# Patient Record
Sex: Female | Born: 1935 | Race: White | Hispanic: No | Smoking: Never smoker
Health system: Southern US, Community
[De-identification: ages and names within clinical notes are randomized; demographics above are authoritative.]

## PROBLEM LIST (undated history)

## (undated) DIAGNOSIS — I10 Essential (primary) hypertension: Secondary | ICD-10-CM

## (undated) DIAGNOSIS — E119 Type 2 diabetes mellitus without complications: Secondary | ICD-10-CM

## (undated) DIAGNOSIS — R569 Unspecified convulsions: Secondary | ICD-10-CM

---

## 2008-05-22 ENCOUNTER — Ambulatory Visit: Payer: Self-pay | Admitting: Family Medicine

## 2008-05-22 DIAGNOSIS — E109 Type 1 diabetes mellitus without complications: Secondary | ICD-10-CM | POA: Insufficient documentation

## 2008-05-22 DIAGNOSIS — I10 Essential (primary) hypertension: Secondary | ICD-10-CM | POA: Insufficient documentation

## 2008-05-22 DIAGNOSIS — K219 Gastro-esophageal reflux disease without esophagitis: Secondary | ICD-10-CM | POA: Insufficient documentation

## 2008-05-22 DIAGNOSIS — J029 Acute pharyngitis, unspecified: Secondary | ICD-10-CM | POA: Insufficient documentation

## 2008-05-23 ENCOUNTER — Encounter: Payer: Self-pay | Admitting: Family Medicine

## 2008-05-23 ENCOUNTER — Telehealth (INDEPENDENT_AMBULATORY_CARE_PROVIDER_SITE_OTHER): Payer: Self-pay | Admitting: *Deleted

## 2008-05-23 LAB — CONVERTED CEMR LAB: Streptococcus, Group A Screen (Direct): NEGATIVE

## 2018-10-09 ENCOUNTER — Emergency Department (HOSPITAL_COMMUNITY): Payer: Medicare Other

## 2018-10-09 ENCOUNTER — Inpatient Hospital Stay (HOSPITAL_COMMUNITY): Payer: Medicare Other

## 2018-10-09 ENCOUNTER — Inpatient Hospital Stay (HOSPITAL_COMMUNITY)
Admission: EM | Admit: 2018-10-09 | Discharge: 2018-10-19 | DRG: 603 | Disposition: A | Discharge status: Skilled Nursing Facility | Payer: Medicare Other | Attending: Internal Medicine | Admitting: Internal Medicine

## 2018-10-09 ENCOUNTER — Other Ambulatory Visit: Payer: Self-pay

## 2018-10-09 ENCOUNTER — Encounter (HOSPITAL_COMMUNITY): Payer: Self-pay | Admitting: *Deleted

## 2018-10-09 DIAGNOSIS — N179 Acute kidney failure, unspecified: Secondary | ICD-10-CM | POA: Diagnosis present

## 2018-10-09 DIAGNOSIS — Z79899 Other long term (current) drug therapy: Secondary | ICD-10-CM

## 2018-10-09 DIAGNOSIS — K5900 Constipation, unspecified: Secondary | ICD-10-CM | POA: Diagnosis not present

## 2018-10-09 DIAGNOSIS — Z1159 Encounter for screening for other viral diseases: Secondary | ICD-10-CM | POA: Diagnosis not present

## 2018-10-09 DIAGNOSIS — K7469 Other cirrhosis of liver: Secondary | ICD-10-CM | POA: Diagnosis not present

## 2018-10-09 DIAGNOSIS — L03115 Cellulitis of right lower limb: Secondary | ICD-10-CM | POA: Diagnosis present

## 2018-10-09 DIAGNOSIS — F603 Borderline personality disorder: Secondary | ICD-10-CM | POA: Diagnosis present

## 2018-10-09 DIAGNOSIS — K7290 Hepatic failure, unspecified without coma: Secondary | ICD-10-CM | POA: Diagnosis present

## 2018-10-09 DIAGNOSIS — R680 Hypothermia, not associated with low environmental temperature: Secondary | ICD-10-CM | POA: Diagnosis present

## 2018-10-09 DIAGNOSIS — K7460 Unspecified cirrhosis of liver: Secondary | ICD-10-CM | POA: Diagnosis present

## 2018-10-09 DIAGNOSIS — E875 Hyperkalemia: Secondary | ICD-10-CM | POA: Diagnosis present

## 2018-10-09 DIAGNOSIS — E119 Type 2 diabetes mellitus without complications: Secondary | ICD-10-CM | POA: Diagnosis not present

## 2018-10-09 DIAGNOSIS — G4733 Obstructive sleep apnea (adult) (pediatric): Secondary | ICD-10-CM | POA: Diagnosis present

## 2018-10-09 DIAGNOSIS — T502X5A Adverse effect of carbonic-anhydrase inhibitors, benzothiadiazides and other diuretics, initial encounter: Secondary | ICD-10-CM | POA: Diagnosis not present

## 2018-10-09 DIAGNOSIS — R188 Other ascites: Secondary | ICD-10-CM | POA: Diagnosis present

## 2018-10-09 DIAGNOSIS — Z66 Do not resuscitate: Secondary | ICD-10-CM | POA: Diagnosis present

## 2018-10-09 DIAGNOSIS — K219 Gastro-esophageal reflux disease without esophagitis: Secondary | ICD-10-CM | POA: Diagnosis present

## 2018-10-09 DIAGNOSIS — Z8719 Personal history of other diseases of the digestive system: Secondary | ICD-10-CM | POA: Diagnosis not present

## 2018-10-09 DIAGNOSIS — E11649 Type 2 diabetes mellitus with hypoglycemia without coma: Secondary | ICD-10-CM | POA: Diagnosis present

## 2018-10-09 DIAGNOSIS — D631 Anemia in chronic kidney disease: Secondary | ICD-10-CM | POA: Diagnosis present

## 2018-10-09 DIAGNOSIS — T68XXXA Hypothermia, initial encounter: Secondary | ICD-10-CM

## 2018-10-09 DIAGNOSIS — I5022 Chronic systolic (congestive) heart failure: Secondary | ICD-10-CM | POA: Diagnosis present

## 2018-10-09 DIAGNOSIS — E1122 Type 2 diabetes mellitus with diabetic chronic kidney disease: Secondary | ICD-10-CM | POA: Diagnosis present

## 2018-10-09 DIAGNOSIS — R569 Unspecified convulsions: Secondary | ICD-10-CM | POA: Diagnosis present

## 2018-10-09 DIAGNOSIS — D6959 Other secondary thrombocytopenia: Secondary | ICD-10-CM | POA: Diagnosis present

## 2018-10-09 DIAGNOSIS — N183 Chronic kidney disease, stage 3 (moderate): Secondary | ICD-10-CM | POA: Diagnosis present

## 2018-10-09 DIAGNOSIS — Z794 Long term (current) use of insulin: Secondary | ICD-10-CM | POA: Diagnosis not present

## 2018-10-09 DIAGNOSIS — F319 Bipolar disorder, unspecified: Secondary | ICD-10-CM | POA: Diagnosis present

## 2018-10-09 DIAGNOSIS — I130 Hypertensive heart and chronic kidney disease with heart failure and stage 1 through stage 4 chronic kidney disease, or unspecified chronic kidney disease: Secondary | ICD-10-CM | POA: Diagnosis present

## 2018-10-09 DIAGNOSIS — D649 Anemia, unspecified: Secondary | ICD-10-CM | POA: Diagnosis not present

## 2018-10-09 DIAGNOSIS — E871 Hypo-osmolality and hyponatremia: Secondary | ICD-10-CM | POA: Diagnosis present

## 2018-10-09 DIAGNOSIS — R251 Tremor, unspecified: Secondary | ICD-10-CM | POA: Diagnosis present

## 2018-10-09 DIAGNOSIS — N189 Chronic kidney disease, unspecified: Secondary | ICD-10-CM | POA: Diagnosis not present

## 2018-10-09 DIAGNOSIS — K7581 Nonalcoholic steatohepatitis (NASH): Secondary | ICD-10-CM | POA: Diagnosis present

## 2018-10-09 DIAGNOSIS — E039 Hypothyroidism, unspecified: Secondary | ICD-10-CM | POA: Diagnosis present

## 2018-10-09 DIAGNOSIS — D696 Thrombocytopenia, unspecified: Secondary | ICD-10-CM | POA: Diagnosis not present

## 2018-10-09 DIAGNOSIS — R011 Cardiac murmur, unspecified: Secondary | ICD-10-CM | POA: Diagnosis not present

## 2018-10-09 DIAGNOSIS — I8510 Secondary esophageal varices without bleeding: Secondary | ICD-10-CM | POA: Diagnosis not present

## 2018-10-09 DIAGNOSIS — R239 Unspecified skin changes: Secondary | ICD-10-CM | POA: Diagnosis not present

## 2018-10-09 DIAGNOSIS — L02415 Cutaneous abscess of right lower limb: Secondary | ICD-10-CM | POA: Diagnosis not present

## 2018-10-09 DIAGNOSIS — D899 Disorder involving the immune mechanism, unspecified: Secondary | ICD-10-CM | POA: Diagnosis present

## 2018-10-09 HISTORY — DX: Unspecified convulsions: R56.9

## 2018-10-09 HISTORY — DX: Essential (primary) hypertension: I10

## 2018-10-09 HISTORY — DX: Type 2 diabetes mellitus without complications: E11.9

## 2018-10-09 LAB — BASIC METABOLIC PANEL
Anion gap: 9 (ref 5–15)
BUN: 20 mg/dL (ref 8–23)
CO2: 22 mmol/L (ref 22–32)
Calcium: 8.3 mg/dL — ABNORMAL LOW (ref 8.9–10.3)
Chloride: 98 mmol/L (ref 98–111)
Creatinine, Ser: 1.36 mg/dL — ABNORMAL HIGH (ref 0.44–1.00)
GFR calc Af Amer: 41 mL/min — ABNORMAL LOW (ref >60)
GFR calc non Af Amer: 35 mL/min — ABNORMAL LOW (ref >60)
Glucose, Bld: 178 mg/dL — ABNORMAL HIGH (ref 70–99)
Potassium: 6.1 mmol/L — ABNORMAL HIGH (ref 3.5–5.1)
Sodium: 129 mmol/L — ABNORMAL LOW (ref 135–145)

## 2018-10-09 LAB — CBC WITH DIFFERENTIAL/PLATELET
Abs Immature Granulocytes: 0.1 10*3/uL — ABNORMAL HIGH (ref 0.00–0.07)
Basophils Absolute: 0 10*3/uL (ref 0.0–0.1)
Basophils Relative: 0 %
Eosinophils Absolute: 0 10*3/uL (ref 0.0–0.5)
Eosinophils Relative: 0 %
HCT: 30.3 % — ABNORMAL LOW (ref 36.0–46.0)
Hemoglobin: 10.1 g/dL — ABNORMAL LOW (ref 12.0–15.0)
Immature Granulocytes: 1 %
Lymphocytes Relative: 5 %
Lymphs Abs: 0.5 10*3/uL — ABNORMAL LOW (ref 0.7–4.0)
MCH: 30.8 pg (ref 26.0–34.0)
MCHC: 33.3 g/dL (ref 30.0–36.0)
MCV: 92.4 fL (ref 80.0–100.0)
Monocytes Absolute: 0.5 10*3/uL (ref 0.1–1.0)
Monocytes Relative: 5 %
Neutro Abs: 9.2 10*3/uL — ABNORMAL HIGH (ref 1.7–7.7)
Neutrophils Relative %: 89 %
Platelets: 128 10*3/uL — ABNORMAL LOW (ref 150–400)
RBC: 3.28 MIL/uL — ABNORMAL LOW (ref 3.87–5.11)
RDW: 14 % (ref 11.5–15.5)
WBC: 10.4 10*3/uL (ref 4.0–10.5)
nRBC: 0 % (ref 0.0–0.2)

## 2018-10-09 LAB — COMPREHENSIVE METABOLIC PANEL
ALT: 32 U/L (ref 0–44)
AST: 43 U/L — ABNORMAL HIGH (ref 15–41)
Albumin: 2.9 g/dL — ABNORMAL LOW (ref 3.5–5.0)
Alkaline Phosphatase: 145 U/L — ABNORMAL HIGH (ref 38–126)
Anion gap: 11 (ref 5–15)
BUN: 22 mg/dL (ref 8–23)
CO2: 23 mmol/L (ref 22–32)
Calcium: 8.8 mg/dL — ABNORMAL LOW (ref 8.9–10.3)
Chloride: 94 mmol/L — ABNORMAL LOW (ref 98–111)
Creatinine, Ser: 1.62 mg/dL — ABNORMAL HIGH (ref 0.44–1.00)
GFR calc Af Amer: 33 mL/min — ABNORMAL LOW (ref >60)
GFR calc non Af Amer: 29 mL/min — ABNORMAL LOW (ref >60)
Glucose, Bld: 70 mg/dL (ref 70–99)
Potassium: 5.8 mmol/L — ABNORMAL HIGH (ref 3.5–5.1)
Sodium: 128 mmol/L — ABNORMAL LOW (ref 135–145)
Total Bilirubin: 1.7 mg/dL — ABNORMAL HIGH (ref 0.3–1.2)
Total Protein: 7 g/dL (ref 6.5–8.1)

## 2018-10-09 LAB — MAGNESIUM: Magnesium: 1.9 mg/dL (ref 1.7–2.4)

## 2018-10-09 LAB — URINALYSIS, ROUTINE W REFLEX MICROSCOPIC
Bilirubin Urine: NEGATIVE
Glucose, UA: NEGATIVE mg/dL
Hgb urine dipstick: NEGATIVE
Ketones, ur: NEGATIVE mg/dL
Leukocytes,Ua: NEGATIVE
Nitrite: NEGATIVE
Protein, ur: NEGATIVE mg/dL
Specific Gravity, Urine: 1.013 (ref 1.005–1.030)
pH: 6 (ref 5.0–8.0)

## 2018-10-09 LAB — SARS CORONAVIRUS 2 BY RT PCR (HOSPITAL ORDER, PERFORMED IN ~~LOC~~ HOSPITAL LAB): SARS Coronavirus 2: NEGATIVE

## 2018-10-09 LAB — GLUCOSE, CAPILLARY
Glucose-Capillary: 59 mg/dL — ABNORMAL LOW (ref 70–99)
Glucose-Capillary: 76 mg/dL (ref 70–99)
Glucose-Capillary: 191 mg/dL — ABNORMAL HIGH (ref 70–99)
Glucose-Capillary: 225 mg/dL — ABNORMAL HIGH (ref 70–99)

## 2018-10-09 LAB — CBG MONITORING, ED
Glucose-Capillary: 54 mg/dL — ABNORMAL LOW (ref 70–99)
Glucose-Capillary: 110 mg/dL — ABNORMAL HIGH (ref 70–99)
Glucose-Capillary: 123 mg/dL — ABNORMAL HIGH (ref 70–99)

## 2018-10-09 LAB — LACTIC ACID, PLASMA
Lactic Acid, Venous: 2.2 mmol/L (ref 0.5–1.9)
Lactic Acid, Venous: 2.4 mmol/L (ref 0.5–1.9)

## 2018-10-09 LAB — PROTIME-INR
INR: 1.2 (ref 0.8–1.2)
Prothrombin Time: 15 seconds (ref 11.4–15.2)

## 2018-10-09 LAB — HEMOGLOBIN A1C
Hgb A1c MFr Bld: 5.5 % (ref 4.8–5.6)
Mean Plasma Glucose: 111.15 mg/dL

## 2018-10-09 LAB — AMMONIA: Ammonia: 14 umol/L (ref 9–35)

## 2018-10-09 LAB — CORTISOL-AM, BLOOD: Cortisol - AM: 25.7 ug/dL — ABNORMAL HIGH (ref 6.7–22.6)

## 2018-10-09 LAB — TSH: TSH: 2.172 u[IU]/mL (ref 0.350–4.500)

## 2018-10-09 LAB — TROPONIN I
Troponin I: <0.03 ng/mL (ref <0.03)
Troponin I: <0.03 ng/mL (ref <0.03)

## 2018-10-09 MED ORDER — LACTATED RINGERS IV BOLUS
1000.0000 mL | Freq: Once | INTRAVENOUS | Status: AC
  Administered 2018-10-09: 1000 mL via INTRAVENOUS

## 2018-10-09 MED ORDER — RIFAXIMIN 550 MG PO TABS
550.0000 mg | ORAL_TABLET | Freq: Two times a day (BID) | ORAL | Status: DC
  Administered 2018-10-09 – 2018-10-19 (×19): 550 mg via ORAL
  Filled 2018-10-09 (×22): qty 1

## 2018-10-09 MED ORDER — SODIUM CHLORIDE 0.9% FLUSH
3.0000 mL | Freq: Two times a day (BID) | INTRAVENOUS | Status: DC
  Administered 2018-10-09 – 2018-10-19 (×17): 3 mL via INTRAVENOUS

## 2018-10-09 MED ORDER — LEVETIRACETAM 500 MG PO TABS
500.0000 mg | ORAL_TABLET | Freq: Two times a day (BID) | ORAL | Status: DC
  Administered 2018-10-09 – 2018-10-19 (×19): 500 mg via ORAL
  Filled 2018-10-09 (×20): qty 1

## 2018-10-09 MED ORDER — LEVOTHYROXINE SODIUM 88 MCG PO TABS
88.0000 ug | ORAL_TABLET | Freq: Every day | ORAL | Status: DC
  Administered 2018-10-09 – 2018-10-19 (×9): 88 ug via ORAL
  Filled 2018-10-09 (×10): qty 1

## 2018-10-09 MED ORDER — INSULIN ASPART 100 UNIT/ML ~~LOC~~ SOLN
0.0000 [IU] | Freq: Three times a day (TID) | SUBCUTANEOUS | Status: DC
  Administered 2018-10-10: 2 [IU] via SUBCUTANEOUS
  Administered 2018-10-10: 3 [IU] via SUBCUTANEOUS
  Administered 2018-10-11: 2 [IU] via SUBCUTANEOUS
  Administered 2018-10-12 (×2): 1 [IU] via SUBCUTANEOUS
  Administered 2018-10-13 – 2018-10-14 (×2): 2 [IU] via SUBCUTANEOUS
  Administered 2018-10-14: 3 [IU] via SUBCUTANEOUS
  Administered 2018-10-14: 1 [IU] via SUBCUTANEOUS
  Administered 2018-10-15: 2 [IU] via SUBCUTANEOUS
  Administered 2018-10-15: 1 [IU] via SUBCUTANEOUS
  Administered 2018-10-15 – 2018-10-16 (×2): 2 [IU] via SUBCUTANEOUS
  Administered 2018-10-16: 5 [IU] via SUBCUTANEOUS
  Administered 2018-10-16 – 2018-10-17 (×3): 2 [IU] via SUBCUTANEOUS
  Administered 2018-10-17: 3 [IU] via SUBCUTANEOUS
  Administered 2018-10-18: 2 [IU] via SUBCUTANEOUS

## 2018-10-09 MED ORDER — DULOXETINE HCL 60 MG PO CPEP
60.0000 mg | ORAL_CAPSULE | Freq: Every day | ORAL | Status: DC
  Administered 2018-10-09 – 2018-10-19 (×11): 60 mg via ORAL
  Filled 2018-10-09 (×12): qty 1

## 2018-10-09 MED ORDER — ACETAMINOPHEN 325 MG PO TABS
650.0000 mg | ORAL_TABLET | Freq: Four times a day (QID) | ORAL | Status: DC | PRN
  Administered 2018-10-19: 650 mg via ORAL
  Filled 2018-10-09: qty 2

## 2018-10-09 MED ORDER — SODIUM CHLORIDE 0.9 % IV SOLN: 1.0000 g | Freq: Once | INTRAVENOUS | Status: DC

## 2018-10-09 MED ORDER — VANCOMYCIN HCL 10 G IV SOLR
1750.0000 mg | Freq: Once | INTRAVENOUS | Status: AC
  Administered 2018-10-09: 1750 mg via INTRAVENOUS
  Filled 2018-10-09: qty 1750

## 2018-10-09 MED ORDER — NADOLOL 20 MG PO TABS
20.0000 mg | ORAL_TABLET | Freq: Every day | ORAL | Status: DC
  Administered 2018-10-09 – 2018-10-19 (×11): 20 mg via ORAL
  Filled 2018-10-09 (×12): qty 1

## 2018-10-09 MED ORDER — INSULIN ASPART 100 UNIT/ML IV SOLN
10.0000 [IU] | Freq: Once | INTRAVENOUS | Status: AC
  Administered 2018-10-09: 10 [IU] via INTRAVENOUS

## 2018-10-09 MED ORDER — SODIUM ZIRCONIUM CYCLOSILICATE 10 G PO PACK
10.0000 g | PACK | Freq: Once | ORAL | Status: AC
  Administered 2018-10-09: 10 g via ORAL
  Filled 2018-10-09: qty 1

## 2018-10-09 MED ORDER — VANCOMYCIN HCL IN DEXTROSE 1-5 GM/200ML-% IV SOLN
1000.0000 mg | INTRAVENOUS | Status: DC
  Administered 2018-10-10 – 2018-10-11 (×2): 1000 mg via INTRAVENOUS
  Filled 2018-10-09 (×2): qty 200

## 2018-10-09 MED ORDER — ACETAMINOPHEN 650 MG RE SUPP: 650.0000 mg | Freq: Four times a day (QID) | RECTAL | Status: DC | PRN

## 2018-10-09 MED ORDER — ENOXAPARIN SODIUM 30 MG/0.3ML ~~LOC~~ SOLN
30.0000 mg | Freq: Every day | SUBCUTANEOUS | Status: DC
  Administered 2018-10-09 – 2018-10-10 (×2): 30 mg via SUBCUTANEOUS
  Filled 2018-10-09 (×2): qty 0.3

## 2018-10-09 MED ORDER — INSULIN ASPART 100 UNIT/ML ~~LOC~~ SOLN
0.0000 [IU] | Freq: Every day | SUBCUTANEOUS | Status: DC
  Administered 2018-10-09: 2 [IU] via SUBCUTANEOUS
  Administered 2018-10-10: 1 [IU] via SUBCUTANEOUS

## 2018-10-09 MED ORDER — ARTIFICIAL TEARS OPHTHALMIC OINT
TOPICAL_OINTMENT | OPHTHALMIC | Status: DC | PRN
  Filled 2018-10-09: qty 3.5

## 2018-10-09 MED ORDER — LACTULOSE 10 GM/15ML PO SOLN
10.0000 g | Freq: Three times a day (TID) | ORAL | Status: DC
  Administered 2018-10-09 – 2018-10-19 (×26): 10 g via ORAL
  Filled 2018-10-09 (×31): qty 15

## 2018-10-09 MED ORDER — GLUCERNA SHAKE PO LIQD
237.0000 mL | Freq: Three times a day (TID) | ORAL | Status: DC
  Administered 2018-10-09 – 2018-10-19 (×14): 237 mL via ORAL
  Filled 2018-10-09 (×32): qty 237

## 2018-10-09 MED ORDER — CALCIUM GLUCONATE-NACL 1-0.675 GM/50ML-% IV SOLN
1.0000 g | Freq: Once | INTRAVENOUS | Status: AC
  Administered 2018-10-09: 1000 mg via INTRAVENOUS
  Filled 2018-10-09: qty 50

## 2018-10-09 MED ORDER — DEXTROSE 50 % IV SOLN: 1.0000 | Freq: Once | INTRAVENOUS | Status: DC

## 2018-10-09 MED ORDER — DEXTROSE 50 % IV SOLN
50.0000 mL | Freq: Once | INTRAVENOUS | Status: AC
  Administered 2018-10-09: 50 mL via INTRAVENOUS
  Filled 2018-10-09: qty 50

## 2018-10-09 MED ORDER — CALCIUM GLUCONATE-NACL 1-0.675 GM/50ML-% IV SOLN
1.0000 g | Freq: Once | INTRAVENOUS | Status: AC
  Administered 2018-10-09: 1000 mg via INTRAVENOUS
  Filled 2018-10-09 (×2): qty 50

## 2018-10-09 MED ORDER — INSULIN GLARGINE 100 UNIT/ML ~~LOC~~ SOLN
10.0000 [IU] | Freq: Every day | SUBCUTANEOUS | Status: DC
  Filled 2018-10-09: qty 0.1

## 2018-10-09 MED ORDER — ARIPIPRAZOLE 5 MG PO TABS
15.0000 mg | ORAL_TABLET | Freq: Every day | ORAL | Status: DC
  Administered 2018-10-09 – 2018-10-19 (×11): 15 mg via ORAL
  Filled 2018-10-09 (×11): qty 3

## 2018-10-09 MED ORDER — INSULIN ASPART 100 UNIT/ML ~~LOC~~ SOLN: 10.0000 [IU] | Freq: Once | SUBCUTANEOUS | Status: DC

## 2018-10-09 MED ORDER — SODIUM CHLORIDE 0.9 % IV SOLN
1.0000 g | Freq: Once | INTRAVENOUS | Status: AC
  Administered 2018-10-09: 1 g via INTRAVENOUS
  Filled 2018-10-09: qty 1

## 2018-10-09 NOTE — Progress Notes (Addendum)
Pharmacy Antibiotic Note  Brandy Anderson is a 85 y.o. female admitted on 10/09/2018 with multiple comorbidities. Starting antibiotics for cellulitis. Scr 1.6, eCrCl ~ 30 ml/min. Vancomycin listed in allergy list - appears she tolerated the dose from early this morning - will need to monitor.   Plan: -Vancomycin 1750 mg IV x1 then 1g/24h -Monitor renal fx, cultures, VR as needed   Height: 5\' 5"  (165.1 cm) Weight: 200 lb (90.7 kg) IBW/kg (Calculated) : 57  Temp (24hrs), Avg:96.6 F (35.9 C), Min:93.3 F (34.1 C), Max:98.1 F (36.7 C)  Recent Labs  Lab 10/09/18 0409 10/09/18 0641  WBC 10.4  --   CREATININE 1.62*  --   LATICACIDVEN 2.2* 2.4*      Antimicrobials this admission: 5/16 vancomycin > 5/16 cefepime x1  Dose adjustments this admission: N/A  Microbiology results: 5/16 blood cx: 5/16 Covid-19: neg   Baldemar Friday 10/09/2018 10:27 AM

## 2018-10-09 NOTE — ED Notes (Signed)
ED TO INPATIENT HANDOFF REPORT  ED Nurse Name and Phone #: Maralyn Sago [phone]  S Name/Age/Gender Brandy Anderson 85 y.o. female Room/Bed: 015C/015C  Code Status   Code Status: DNR  Home/SNF/Other Nursing Home Patient oriented to: self, place, time and situation Is this baseline? Yes   Triage Complete: Triage complete  Chief Complaint headache; AMS   Triage Note [facility]   C/o a headache and was found staring into space by staff.  Ems arrived  Pt talking   Neg for stroke     Some diaphoresis  Hx of seizures   Allergies Not on File  Level of Care/Admitting Diagnosis ED Disposition    ED Disposition Condition Comment   Admit  Hospital Area: [facility] [[id number]]  Level of Care: Telemetry Medical [104]  Covid Evaluation: N/A  Diagnosis: Cellulitis [130865]  Admitting Physician: Tyson Alias [[id number]]  Attending Physician: Tyson Alias [[id number]]  Estimated length of stay: past midnight tomorrow  Certification:: I certify this patient will need inpatient services for at least 2 midnights  PT Class (Do Not Modify): Inpatient [101]  PT Acc Code (Do Not Modify): Private [1]       B Medical/Surgery History Past Medical History:  Diagnosis Date  . Diabetes mellitus without complication (HCC)   . Hypertension   . Seizures (HCC)       A IV Location/Drains/Wounds Patient Lines/Drains/Airways Status   Active Line/Drains/Airways    Name:   Placement date:   Placement time:   Site:   Days:   Peripheral IV 10/09/18 Right Hand   10/09/18    0352    Hand   less than 1   Peripheral IV 10/09/18 Left Antecubital   10/09/18    0410    Antecubital   less than 1          Intake/Output Last 24 hours  Intake/Output Summary (Last 24 hours) at 10/09/2018 2440 Last data filed at 10/09/2018 1027 Gross per 24 hour  Intake 2550 ml  Output -  Net 2550 ml    Labs/Imaging Results for orders placed or performed  during the hospital encounter of 10/09/18 (from the past 48 hour(s))  CBG monitoring, ED     Status: Abnormal   Collection Time: 10/09/18  3:53 AM  Result Value Ref Range   Glucose-Capillary 54 (L) 70 - 99 mg/dL  Lactic acid, plasma     Status: Abnormal   Collection Time: 10/09/18  4:09 AM  Result Value Ref Range   Lactic Acid, Venous 2.2 (HH) 0.5 - 1.9 mmol/L    Comment: CRITICAL RESULT CALLED TO, READ BACK BY AND VERIFIED WITH: GIBSON,K RN 10/09/2018 0502 JORDANS Performed at [facility], [address], [place], [place] [address]   Comprehensive metabolic panel     Status: Abnormal   Collection Time: 10/09/18  4:09 AM  Result Value Ref Range   Sodium 128 (L) 135 - 145 mmol/L   Potassium 5.8 (H) 3.5 - 5.1 mmol/L   Chloride 94 (L) 98 - 111 mmol/L   CO2 23 22 - 32 mmol/L   Glucose, Bld 70 70 - 99 mg/dL   BUN 22 8 - 23 mg/dL   Creatinine, Ser 4.40 (H) 0.44 - 1.00 mg/dL   Calcium 8.8 (L) 8.9 - 10.3 mg/dL   Total Protein 7.0 6.5 - 8.1 g/dL   Albumin 2.9 (L) 3.5 - 5.0 g/dL   AST 43 (H) 15 -  41 U/L   ALT 32 0 - 44 U/L   Alkaline Phosphatase 145 (H) 38 - 126 U/L   Total Bilirubin 1.7 (H) 0.3 - 1.2 mg/dL   GFR calc non Af Amer 29 (L) >60 mL/min   GFR calc Af Amer 33 (L) >60 mL/min   Anion gap 11 5 - 15    Comment: Performed at [facility], [address], [place], [place] [address]  CBC WITH DIFFERENTIAL     Status: Abnormal   [facility]  4:09 AM  Result Value Ref Range   WBC 10.4 4.0 - 10.5 K/uL   RBC 3.28 (L) 3.87 - 5.11 MIL/uL   Hemoglobin 10.1 (L) 12.0 - 15.0 g/dL   HCT 60.4 (L) 54.0 - 98.1 %   MCV 92.4 80.0 - 100.0 fL   MCH 30.8 26.0 - 34.0 pg   MCHC 33.3 30.0 - 36.0 g/dL   RDW 19.1 47.8 - 29.5 %   Platelets 128 (L) 150 - 400 K/uL   nRBC 0.0 0.0 - 0.2 %   Neutrophils Relative % 89 %   Neutro Abs 9.2 (H) 1.7 - 7.7 K/uL   Lymphocytes Relative 5 %   Lymphs Abs 0.5 (L) 0.7 - 4.0 K/uL   Monocytes Relative 5 %   Monocytes Absolute 0.5 0.1 - 1.0  K/uL   Eosinophils Relative 0 %   Eosinophils Absolute 0.0 0.0 - 0.5 K/uL   Basophils Relative 0 %   Basophils Absolute 0.0 0.0 - 0.1 K/uL   Immature Granulocytes 1 %   Abs Immature Granulocytes 0.10 (H) 0.00 - 0.07 K/uL    Comment: Performed at [facility], [address], [place], [place] [address]  TSH     Status: None   Collection Time: 10/09/18  4:09 AM  Result Value Ref Range   TSH 2.172 0.350 - 4.500 uIU/mL    Comment: Performed by a 3rd Generation assay with a functional sensitivity of <=0.01 uIU/mL. Performed at [facility], [address] [place], [place] [address]   Ammonia     Status: None   Collection Time: 10/09/18  4:09 AM  Result Value Ref Range   Ammonia 14 9 - 35 umol/L    Comment: Performed at [facility], [address], [place], [place] [address]  Troponin I - ONCE - STAT     Status: None   Collection Time: 10/09/18  4:09 AM  Result Value Ref Range   Troponin I <0.03 <0.03 ng/mL    Comment: Performed at [facility], [address] [place], [place] [address]  CBG monitoring, ED     Status: Abnormal   Collection Time: 10/09/18  4:40 AM  Result Value Ref Range   Glucose-Capillary 110 (H) 70 - 99 mg/dL  SARS Coronavirus 2 (CEPHEID - Performed in [facility] hospital lab), Hosp Order     Status: None   Collection Time: 10/09/18  4:59 AM  Result Value Ref Range   SARS Coronavirus 2 NEGATIVE NEGATIVE    Comment: (NOTE) If result is NEGATIVE SARS-CoV-2 target nucleic acids are NOT DETECTED. The SARS-CoV-2 RNA is generally detectable in upper and lower  respiratory specimens during the acute phase of infection. The lowest  concentration of SARS-CoV-2 viral copies this assay can detect is 250  copies / mL. A negative result does not preclude SARS-CoV-2 infection  and should not be used as the sole basis for treatment or other  patient management decisions.  A negative result may occur with  improper specimen collection / handling,  submission of specimen other  than nasopharyngeal swab, presence of viral mutation(s) within the  areas targeted by this assay, and inadequate number of viral copies  (<250 copies / mL). A negative result must be combined with clinical  observations, patient history, and epidemiological information. If result is POSITIVE SARS-CoV-2 target nucleic acids are DETECTED. The SARS-CoV-2 RNA is generally detectable in upper and lower  respiratory specimens dur ing the acute phase of infection.  Positive  results are indicative of active infection with SARS-CoV-2.  Clinical  correlation with patient history and other diagnostic information is  necessary to determine patient infection status.  Positive results do  not rule out bacterial infection or co-infection with other viruses. If result is PRESUMPTIVE POSTIVE SARS-CoV-2 nucleic acids MAY BE PRESENT.   A presumptive positive result was obtained on the submitted specimen  and confirmed on repeat testing.  While 2019 novel coronavirus  (SARS-CoV-2) nucleic acids may be present in the submitted sample  additional confirmatory testing may be necessary for epidemiological  and / or clinical management purposes  to differentiate between  SARS-CoV-2 and other Sarbecovirus currently known to infect humans.  If clinically indicated additional testing with an alternate test  methodology [phone]) is advised. The SARS-CoV-2 RNA is generally  detectable in upper and lower respiratory sp ecimens during the acute  phase of infection. The expected result is Negative. Fact Sheet for Patients:  BoilerBrush.com.cy Fact Sheet for Healthcare Providers: https://pope.com/ This test is not yet approved or cleared by the [place] FDA and has been authorized for detection and/or diagnosis of SARS-CoV-2 by FDA under an Emergency Use Authorization (EUA).  This EUA will remain in effect (meaning this test can be  used) for the duration of the COVID-19 declaration under Section 564(b)(1) of the Act, 21 U.S.C. section 360bbb-3(b)(1), unless the authorization is terminated or revoked sooner. Performed at [facility], [address] [place], [place] [address]   CBG monitoring, ED     Status: Abnormal   Collection Time: 10/09/18  5:50 AM  Result Value Ref Range   Glucose-Capillary 123 (H) 70 - 99 mg/dL  Lactic acid, plasma     Status: Abnormal   Collection Time: 10/09/18  6:41 AM  Result Value Ref Range   Lactic Acid, Venous 2.4 (HH) 0.5 - 1.9 mmol/L    Comment: CRITICAL RESULT CALLED TO, READ BACK BY AND VERIFIED WITH: S.Stacyann Mcconaughy,RN @ 0732 10/09/2018 WEBBERJ Performed at [facility], [address], [place], [place] [address]   Urinalysis, Routine w reflex microscopic     Status: None   Collection Time: 10/09/18  6:50 AM  Result Value Ref Range   Color, Urine YELLOW YELLOW   APPearance CLEAR CLEAR   Specific Gravity, Urine 1.013 1.005 - 1.030   pH 6.0 5.0 - 8.0   Glucose, UA NEGATIVE NEGATIVE mg/dL   Hgb urine dipstick NEGATIVE NEGATIVE   Bilirubin Urine NEGATIVE NEGATIVE   Ketones, ur NEGATIVE NEGATIVE mg/dL   Protein, ur NEGATIVE NEGATIVE mg/dL   Nitrite NEGATIVE NEGATIVE   Leukocytes,Ua NEGATIVE NEGATIVE    Comment: Performed at [facility], [address], [place], [place] [address]   Ct Head Wo Contrast  Result Date: 10/09/2018 CLINICAL DATA:  Altered mental status EXAM: CT HEAD WITHOUT CONTRAST TECHNIQUE: Contiguous axial images were obtained from the base of the skull through the vertex without intravenous  contrast. COMPARISON:  None. FINDINGS: Brain: There is no mass, hemorrhage or extra-axial collection. There is generalized atrophy without lobar predilection. Hypodensity of the white matter is most commonly associated with chronic microvascular disease. Vascular: No abnormal hyperdensity of the major intracranial arteries or dural venous sinuses. No intracranial  atherosclerosis. Skull: The visualized skull base, calvarium and extracranial soft tissues are normal. Sinuses/Orbits: No fluid levels or advanced mucosal thickening of the visualized paranasal sinuses. No mastoid or middle ear effusion. The orbits are normal. IMPRESSION: Chronic small vessel ischemia without acute intracranial abnormality. Electronically Signed   By: Deatra Robinson M.D.   On: 10/09/2018 04:41   Dg Chest Portable 1 View  Result Date: 10/09/2018 CLINICAL DATA:  Altered mental status EXAM: PORTABLE CHEST 1 VIEW COMPARISON:  None. FINDINGS: The heart size and mediastinal contours are within normal limits. Both lungs are clear. The visualized skeletal structures are unremarkable. IMPRESSION: No active disease. Electronically Signed   By: Deatra Robinson M.D.   On: 10/09/2018 04:25    Pending Labs Unresulted Labs (From admission, onward)    Start     Ordered   10/10/18 0500  Comprehensive metabolic panel  Tomorrow morning,   R     10/09/18 0949   10/10/18 0500  CBC  Tomorrow morning,   R     10/09/18 0949   10/10/18 0500  Protime-INR  Tomorrow morning,   R     10/09/18 0949   10/09/18 0953  Magnesium  Add-on,   R     10/09/18 0952   10/09/18 0953  Cortisol-am, blood  Once,   R     10/09/18 0952   10/09/18 0949  Hemoglobin A1c  Add-on,   R    Comments:  To assess prior glycemic control    10/09/18 0949   10/09/18 0924  Protime-INR  Add-on,   R     10/09/18 0923   10/09/18 0416  Levetiracetam level  Once,   R     10/09/18 0415   10/09/18 0355  Blood Culture (routine x 2)  BLOOD CULTURE X 2,   STAT     10/09/18 0356          Vitals/Pain Today's Vitals   10/09/18 0900 10/09/18 0912 10/09/18 0929 10/09/18 0930  BP: (!) 135/57   116/64  Pulse: 82   84  Resp: 19   (!) 21  Temp:   (!) 97.5 F (36.4 C)   TempSrc:   Oral   SpO2: 100%   100%  Weight:      Height:      PainSc:  Asleep      Isolation Precautions No active isolations  Medications Medications   enoxaparin (LOVENOX) injection 30 mg (has no administration in time range)  sodium chloride flush (NS) 0.9 % injection 3 mL (has no administration in time range)  acetaminophen (TYLENOL) tablet 650 mg (has no administration in time range)    Or  acetaminophen (TYLENOL) suppository 650 mg (has no administration in time range)  insulin aspart (novoLOG) injection 0-9 Units (has no administration in time range)  insulin aspart (novoLOG) injection 0-5 Units (has no administration in time range)  insulin glargine (LANTUS) injection 10 Units (has no administration in time range)  lactulose (CHRONULAC) 10 GM/15ML solution 10 g (has no administration in time range)  DULoxetine (CYMBALTA) DR capsule 60 mg (has no administration in time range)  nadolol (CORGARD) tablet 20 mg (has no administration in time range)  levothyroxine (SYNTHROID)  tablet 88 mcg (has no administration in time range)  rifaximin (XIFAXAN) tablet 550 mg (has no administration in time range)  dextrose 50 % solution 50 mL (50 mLs Intravenous Given 10/09/18 0408)  vancomycin (VANCOCIN) 1,750 mg in sodium chloride 0.9 % 500 mL IVPB (0 mg Intravenous Stopped 10/09/18 0715)  ceFEPIme (MAXIPIME) 1 g in sodium chloride 0.9 % 100 mL IVPB (0 g Intravenous Stopped 10/09/18 0512)  lactated ringers bolus 1,000 mL (0 mLs Intravenous Stopped 10/09/18 0912)  calcium gluconate 1 g/ 50 mL sodium chloride IVPB (0 g Intravenous Stopped 10/09/18 0759)  lactated ringers bolus 1,000 mL (0 mLs Intravenous Stopped 10/09/18 0912)    Mobility manual wheelchair High fall risk   Focused Assessments Neuro Assessment Handoff:           Neuro Assessment: Within Defined Limits Neuro Checks:      Last Documented NIHSS Modified Score:   Has TPA been given? No If patient is a Neuro Trauma and patient is going to OR before floor call report to 4N Charge nurse: [phone] or [phone]     R Recommendations: See Admitting Provider Note  Report  given to:   Additional Notes:

## 2018-10-09 NOTE — Progress Notes (Signed)
NEW ADMISSION NOTE New Admission Note:   Arrival Method: via stretcher from ED  Mental Orientation: alert and oriented x3 (not to situation)  Telemetry: box 4  Assessment: Completed Skin: see assessment  IV:LAC and right hand  Pain:denies   Tubes:none  Safety Measures: Safety Fall Prevention Plan has been discussed  Admission:pending  [facility] Orientation: Patient has been orientated to the room, unit and staff.  Family:none   Orders have been reviewed and implemented. Will continue to monitor the patient. Call light has been placed within reach and bed alarm has been activated.   Leonia Reeves, RN

## 2018-10-09 NOTE — ED Notes (Signed)
Admitting MD notified this RN she will call pt's family with update.

## 2018-10-09 NOTE — ED Notes (Signed)
This RN updated [employer] on pt's condition

## 2018-10-09 NOTE — Progress Notes (Signed)
Patient refused CPAP tonight. There Isn't a machine in the room at this time. Explained to Patient that if they changed their mind, to just have the RN call Respiratory and we would come set them up. 

## 2018-10-09 NOTE — ED Triage Notes (Signed)
[facility]   C/o a headache and was found staring into space by staff.  Ems arrived  Pt talking   Neg for stroke     Some diaphoresis  Hx of seizures

## 2018-10-09 NOTE — ED Notes (Signed)
Pt has no history in thehospital records  Cellulitis rt [place] being trated by antibiotics

## 2018-10-09 NOTE — ED Provider Notes (Signed)
Emergency Department Provider Note   I have reviewed the triage vital signs and the nursing notes.   HISTORY  Chief Complaint Altered Mental Status   HPI Brandy Anderson is a 85 y.o. female with diabetes, hypertension, seizures multiple other medical problems who presents emergency department today secondary to altered mental status.  Patient is apparently normally alert and oriented x4.  The nursing staff stated that tonight she was drooling of the left side of her mouth and appeared to have left facial droop as well today sent here for further evaluation.  Patient is disoriented and cannot participate very well in history taking.  She states that her leg hurts and has cellulitis and she also states that her abdomen seems to be more distended than normal. denies dark/bloody stools.   I discussed with her daughter on the phone who is the patient's healthcare power of attorney and she states that she is been treated for cellulitis in her right leg and is already had a DVT study about 5 days ago that was negative.  She states that her mother has multiple psychiatric issues and is a history of seizure on Keppra but this sounds abnormal to her.  She has not seen her mother since the middle of March because the facility has been locked down to visitors.  No other associated or modifying symptoms.   Level V Caveat Secondary to AMS  Past Medical History:  Diagnosis Date  . Diabetes mellitus without complication (HCC)   . Hypertension   . [facility]izures [facility]     Patient Active Problem List   Diagnosis Date Noted  . DIABETES MELLITUS, TYPE I 05/22/2008  . HYPERTENSION 05/22/2008  . ACUTE PHARYNGITIS 05/22/2008  . GERD 05/22/2008     The histories are not reviewed yet. Please review them in the "History" navigator section and refresh this SmartLink.    Allergies Patient has no allergy information on record.  No family history on file.  Social History Social History   Tobacco Use   . Smoking status: Never Smoker  . Smokeless tobacco: Never Used  Substance Use Topics  . Alcohol use: Never    Frequency: Never  . Drug use: Not on file    Review of Systems  Level V Caveat Secondary to AMS ____________________________________________   PHYSICAL EXAM:  VITAL SIGNS: Vitals:   10/09/18 0500 10/09/18 0530 10/09/18 0641 10/09/18 0700  BP: 131/81 (!) 147/66  124/62  Pulse: 64 67  74  Resp: 11 13  17   Temp:   (!) 97.3 F (36.3 C)   TempSrc:   Oral   SpO2: 100% 100%  100%  Weight:      Height:       Constitutional: Alert and oriented to name, location and month but not year, situation. Well appearing and in no acute distress. Eyes: Conjunctivae are normal. PERRL. EOMI. Head: Atraumatic. Nose: No congestion/rhinnorhea. Mouth/Throat: Mucous membranes are moist.  Oropharynx non-erythematous. Neck: No stridor.  No meningeal signs.   Cardiovascular: Normal rate, regular rhythm. Good peripheral circulation. Grossly normal heart sounds.   Respiratory: tachypneic respiratory effort.  No retractions. Lungs CTAB. Gastrointestinal: Soft and nontender. Mild distention.  Small umbilical hernia easily reducible.  Musculoskeletal: No lower extremity tenderness nor edema. No gross deformities of extremities. Neurologic:  Normal speech and language. No gross focal neurologic deficits are appreciated.  Skin:  Skin is warm, dry and intact. Significant erythema and swelling with ttp/induration to right lower extremity.   ____________________________________________   Vickie Epley (  all labs ordered are listed, but only abnormal results are displayed)  Labs Reviewed  LACTIC ACID, PLASMA - Abnormal; Notable for the following components:      Result Value   Lactic Acid, Venous 2.2 (*)    All other components within normal limits  COMPREHENSIVE METABOLIC PANEL - Abnormal; Notable for the following components:   Sodium 128 (*)    Potassium 5.8 (*)    Chloride 94 (*)    Creatinine,  Ser 1.62 (*)    Calcium 8.8 (*)    Albumin 2.9 (*)    AST 43 (*)    Alkaline Phosphatase 145 (*)    Total Bilirubin 1.7 (*)    GFR calc non Af Amer 29 (*)    GFR calc Af Amer 33 (*)    All other components within normal limits  CBC WITH DIFFERENTIAL/PLATELET - Abnormal; Notable for the following components:   RBC 3.28 (*)    Hemoglobin 10.1 (*)    HCT 30.3 (*)    Platelets 128 (*)    Neutro Abs 9.2 (*)    Lymphs Abs 0.5 (*)    Abs Immature Granulocytes 0.10 (*)    All other components within normal limits  CBG MONITORING, ED - Abnormal; Notable for the following components:   Glucose-Capillary 54 (*)    All other components within normal limits  CBG MONITORING, ED - Abnormal; Notable for the following components:   Glucose-Capillary 110 (*)    All other components within normal limits  CBG MONITORING, ED - Abnormal; Notable for the following components:   Glucose-Capillary 123 (*)    All other components within normal limits  SARS CORONAVIRUS 2 (HOSPITAL ORDER, PERFORMED IN [facility])  CULTURE, BLOOD (ROUTINE X 2)  CULTURE, BLOOD (ROUTINE X 2)  URINALYSIS, ROUTINE W REFLEX MICROSCOPIC  TSH  AMMONIA  TROPONIN I  LACTIC ACID, PLASMA  LEVETIRACETAM LEVEL   ____________________________________________  EKG   EKG Interpretation  Date/Time:  Saturday Oct 09 2018 04:34:40 EDT Ventricular Rate:  70 PR Interval:    QRS Duration: 134 QT Interval:  486 QTC Calculation: 525 R Axis:   54 Text Interpretation:  Sinus rhythm IVCD, consider atypical LBBB No old tracing to compare Confirmed by Marily MemosMesner, Kalia Vahey [phone]([id number]) on 10/09/2018 4:49:01 AM Also confirmed by Marily MemosMesner, Hobart Marte [phone]), editor Barbette Hairassel, Kerry [phone]([id number])  on 10/09/2018 7:16:42 AM       ____________________________________________  RADIOLOGY  Ct Head Wo Contrast  Result Date: 10/09/2018 CLINICAL DATA:  Altered mental status EXAM: CT HEAD WITHOUT CONTRAST TECHNIQUE: Contiguous axial images were obtained from  the base of the skull through the vertex without intravenous contrast. COMPARISON:  None. FINDINGS: Brain: There is no mass, hemorrhage or extra-axial collection. There is generalized atrophy without lobar predilection. Hypodensity of the white matter is most commonly associated with chronic microvascular disease. Vascular: No abnormal hyperdensity of the major intracranial arteries or dural venous sinuses. No intracranial atherosclerosis. Skull: The visualized skull base, calvarium and extracranial soft tissues are normal. Sinuses/Orbits: No fluid levels or advanced mucosal thickening of the visualized paranasal sinuses. No mastoid or middle ear effusion. The orbits are normal. IMPRESSION: Chronic small vessel ischemia without acute intracranial abnormality. Electronically Signed   By: Deatra RobinsonKevin  Herman M.D.   On: 10/09/2018 04:41   Dg Chest Portable 1 View  Result Date: 10/09/2018 CLINICAL DATA:  Altered mental status EXAM: PORTABLE CHEST 1 VIEW COMPARISON:  None. FINDINGS: The heart size and mediastinal contours are within normal limits. Both  lungs are clear. The visualized skeletal structures are unremarkable. IMPRESSION: No active disease. Electronically Signed   By: Deatra Robinson M.D.   On: 10/09/2018 04:25    ____________________________________________   PROCEDURES  Procedure(s) performed:   Procedures   ____________________________________________   INITIAL IMPRESSION / ASSESSMENT AND PLAN / ED COURSE  Patient is hypoglycemic and hypothermic.  Concern for possible hypothyroidism as she does have a history of the same.  Also could be sepsis secondary to the cellulitis in her right leg.  Also consider worsening liver failure with possible abdominal distention will check an ammonia.  Patient will need to be admitted after work-up completed.  Workup as above. At this point she has AKI, hyperkalemia (calcium given), hyponatremia. Suspect this is from the cellulitis. abx given. Will admit.    Clinical Course as of Oct 08 724  Sat Oct 09, 2018  0506 Pulse Rate: 66 [JM]    Clinical Course User Index [JM] Arthurine Oleary, Barbara Cower, MD    Pertinent labs & imaging results that were available during my care of the patient were reviewed by me and considered in my medical decision making (see chart for details).   ____________________________________________  FINAL CLINICAL IMPRESSION(S) / ED DIAGNOSES  Final diagnoses:  Cellulitis of right lower extremity  Hypothermia, initial encounter  AKI (acute kidney injury) (HCC)     MEDICATIONS GIVEN DURING THIS VISIT:  Medications  calcium gluconate 1 g/ 50 mL sodium chloride IVPB (1,000 mg Intravenous New Bag/Given 10/09/18 0709)  dextrose 50 % solution 50 mL (50 mLs Intravenous Given 10/09/18 0408)  vancomycin (VANCOCIN) 1,750 mg in sodium chloride 0.9 % 500 mL IVPB (1,750 mg Intravenous New Bag/Given 10/09/18 0515)  ceFEPIme (MAXIPIME) 1 g in sodium chloride 0.9 % 100 mL IVPB (0 g Intravenous Stopped 10/09/18 0512)  lactated ringers bolus 1,000 mL (1,000 mLs Intravenous New Bag/Given 10/09/18 1610)     NEW OUTPATIENT MEDICATIONS STARTED DURING THIS VISIT:  New Prescriptions   No medications on file    Note:  This note was prepared with assistance of Dragon voice recognition software. Occasional wrong-word or sound-a-like substitutions may have occurred due to the inherent limitations of voice recognition software.   Barth Trella, Barbara Cower, MD 10/09/18 [phone]

## 2018-10-09 NOTE — H&P (Addendum)
Date: 10/09/2018               Patient Name:  Brandy Anderson MRN: [id number]  DOB: 1931-02-13 Age / Sex: 85 y.o., female   PCP: Teodoro Spray, MD         Medical Service: Internal Medicine Teaching Service         Attending Physician: Dr. Oswaldo Done, Marquita Palms, *    First Contact: Dr. Cleaster Corin Pager: [phone]  Second Contact: Dr. Evelene Croon Pager: [phone]       After Hours (After 5p/  First Contact Pager: [phone]  weekends / holidays): Second Contact Pager: [phone]   Chief Complaint: AMS  History of Present Illness:  Brandy Anderson is a 85yo female with PMH TIIDM, HTN, CKD (stage not yet known here), bipolar disorder seizures, acquired hypothyroidism, liver cirrhosis with varices, history of GI hemorrhage and lower extremity cellulitis presenting from [facility] with altered mental status and RLE pain, swelling and redness. Per accompanying paperwork from [facility] she is normally talkative, able to make requests, and last night was not communicating with staff. Meeting with the patient she states she has not been feeling well recently. Her biggest concern is her right leg is hurting a lot and feels swollen. She has been feeling feverish and having chills. She is not sure how long her leg has been hurting or how long she has not been feeling well. She endorses nausea but has not vomited. She states she has still been able to eat and drink well and likes to eat large breakfasts but not a lot for lunch and dinner. She states she has bowel movements usually three times per day but has not had one today.  She is not sure what medications she takes or about her past medical history.   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 She states patient often has intermittent confusion as she frequently will refuse her lactulose. She also refused supper last night, which she believes caused her hypoglycemia.  She also states the patient's right  leg has been infected since around May 6th or 7th. She was initially started on doxycycline, which did not help and she was switched to bactrim. She noticed the erythema in the patient's leg had started to go down after this. The patient is prone to C. Diff per previous history she's gotten from the daughter. Dopplers were done on 5/7 and only showed fluid in the right leg where the bruising appeared and X-ray also showed only fluid as well. CBC showed WBC 11.6, Hgb 9.1, and platelets 99. Her Jonne Ply was held for several days because of this, and she also received an extra dose of lasix on the 8th, 9th, and 10th.   Social:  She stays at [facility].  She has no history of smoking, alcohol use or recreational drug use.  Her son and daughter help her with her medical decision making.   Family History:  No family history on file. She is unsure about her family history   Meds:  No outpatient medications have been marked as taking for the 10/09/18 encounter [facility]).    Allergies: Allergies as of 10/09/2018  . (Not on File)   Past Medical History:  Diagnosis Date  . Diabetes mellitus without complication (HCC)   . Hypertension   . Seizures (HCC)     Review of Systems: A complete ROS was negative except as per HPI.   Physical Exam:  Blood pressure 116/64, pulse 84, temperature (!) 97.5 F (36.4 C), temperature source Oral, resp. rate (!) 21, height 5\' 5"  (1.651 m), weight 90.7 kg, SpO2 100 %.  Constitution: NAD, supine in bed HEENT: eom intact, no icterus or injection, [place]/NT  Cardio: RRR, no m/r/g  Respiratory: CTA, no w/r/r  Abdominal: distended, +BS, NTTP, soft, umbilical hernia MSK: LLE +1 pitting edema, RLE +2 pitting edema with erythema extending up the lateral thigh and darkening lateral calf with hematoma, no crepitus, +asterixis, extremities warm Neuro: alert to self and place, pleasant, normal affect    EKG: personally reviewed my interpretation is left bundle  branch block not present per notes on care everywhere per EKG notes 01/2017, NSR  CXR: personally reviewed my interpretation is no acute findings.   Head CT: chronic small vessel ischemia without acute findings  Assessment & Plan by Problem: Active Problems:   Cellulitis  Brandy Anderson is a 85yo female with PMH TIIDM, HTN, CKD (stage not yet known here), bipolar disorder seizures, acquired hypothyroidism, liver cirrhosis with varices, history of GI hemorrhage and lower extremity cellulitis [facility] altered [facility] status and RLE pain, swelling and redness.   Right Lower Extremity Cellulitis  RLE cellulitis extending midway up the lateral thigh with hematoma along posterior lateral calf that does not show any crepitus. Previously on doxy which did not help and then switched to bactrim. Per [facility] nurse [facility] this had started to help, but I am somewhat concerned as her erythema extends partially up the thigh now, which sounds new, and the nurse had not seen her in two days. Doppler done at [facility] showed no DVT.   - continue vancomycin - am CBC  - blood cultures pending   Hepatic Encephalopathy  Liver Cirrhosis NASH She takes lactulose 3x per day and intermittently refuses this. Also on rifaximin. She did not take her lactulose yesterday, apparently. Currently appears to have mild hepatic encephalopathy as she is mostly oriented but mispronouncing some words. Labs appear stable but will order INR. Abdomen is distended although non-tender.   - continue lactulose 10g qd  - continue rifaximin 550 mg po bid  - holding lasix 20 mg bid and spironolactone 100 mg qd today - will resume tomorrow  - INR - RUQ US  - am CMP - daily weights, strict I/O's - delirium precautions   Electrolyte Abnormalities  Hyponatremic, hyperkalemic and hypoglycemia on arrival. Her hyponatremia may be secondary to her cirrhosis. Hypoglycemia resolved with glucose.  Per nursing she did not eat her dinner last night. Am Cortisol in the ED only mildly elevated at 25. Received Ca gluconate in the ED but also received 2L LR.   - continue to monitor - add on Mg - consider repeat cortisol - am CMP, repeat BMP this afternoon  Chronic Systolic Heart Failure  Diagn[facility] with [facility], last ECHO on care everywhere shows 50-55% EF. On nadolol 20mg  qd. She also takes lasix 20 mg bid, spironolactone 100 mg qd. Recently received extra doses of lasix and 2L in the ED on admission. Troponin .03 in the ED.   - continue nadolol   Type II DM Hypoglycemia - resolved Takes humalog 12U ac before meals and 8U lantus  - SSI HS - SSI sensitive  - lantus 10U qhs - Hbga1c  Hypothyroidism  - continue synthroid 88mcg  Hx of Seizures On Keppra 500 mg bid - cont. keppra    OSA Cont. CPAP  Bipolar Disorder  Continue abilify 15mg  Continue cymbalta 60 mg qd  Diet: carb modified VTE: lovenox IVF: none, received 2L LR Code: DNR  Dispo: Admit patient to Inpatient with expected length of stay greater than 2 midnights.  SignedVersie Starks, DO 10/09/2018, 9:59 AM  Pager: [phone]

## 2018-10-09 NOTE — ED Notes (Signed)
Patient transported to Ultrasound 

## 2018-10-10 DIAGNOSIS — Z872 Personal history of diseases of the skin and subcutaneous tissue: Secondary | ICD-10-CM

## 2018-10-10 DIAGNOSIS — E1122 Type 2 diabetes mellitus with diabetic chronic kidney disease: Secondary | ICD-10-CM

## 2018-10-10 DIAGNOSIS — K7469 Other cirrhosis of liver: Secondary | ICD-10-CM

## 2018-10-10 DIAGNOSIS — Z794 Long term (current) use of insulin: Secondary | ICD-10-CM

## 2018-10-10 DIAGNOSIS — Z79899 Other long term (current) drug therapy: Secondary | ICD-10-CM

## 2018-10-10 DIAGNOSIS — I130 Hypertensive heart and chronic kidney disease with heart failure and stage 1 through stage 4 chronic kidney disease, or unspecified chronic kidney disease: Secondary | ICD-10-CM

## 2018-10-10 DIAGNOSIS — E039 Hypothyroidism, unspecified: Secondary | ICD-10-CM

## 2018-10-10 DIAGNOSIS — N189 Chronic kidney disease, unspecified: Secondary | ICD-10-CM

## 2018-10-10 DIAGNOSIS — Z8719 Personal history of other diseases of the digestive system: Secondary | ICD-10-CM

## 2018-10-10 DIAGNOSIS — I5022 Chronic systolic (congestive) heart failure: Secondary | ICD-10-CM

## 2018-10-10 DIAGNOSIS — K7581 Nonalcoholic steatohepatitis (NASH): Secondary | ICD-10-CM

## 2018-10-10 DIAGNOSIS — R569 Unspecified convulsions: Secondary | ICD-10-CM

## 2018-10-10 DIAGNOSIS — E871 Hypo-osmolality and hyponatremia: Secondary | ICD-10-CM

## 2018-10-10 DIAGNOSIS — K7290 Hepatic failure, unspecified without coma: Secondary | ICD-10-CM

## 2018-10-10 DIAGNOSIS — F603 Borderline personality disorder: Secondary | ICD-10-CM

## 2018-10-10 DIAGNOSIS — L03115 Cellulitis of right lower limb: Principal | ICD-10-CM

## 2018-10-10 DIAGNOSIS — F319 Bipolar disorder, unspecified: Secondary | ICD-10-CM

## 2018-10-10 DIAGNOSIS — E875 Hyperkalemia: Secondary | ICD-10-CM

## 2018-10-10 DIAGNOSIS — E11649 Type 2 diabetes mellitus with hypoglycemia without coma: Secondary | ICD-10-CM

## 2018-10-10 DIAGNOSIS — Z66 Do not resuscitate: Secondary | ICD-10-CM

## 2018-10-10 LAB — CBC
HCT: 26.3 % — ABNORMAL LOW (ref 36.0–46.0)
Hemoglobin: 9.1 g/dL — ABNORMAL LOW (ref 12.0–15.0)
MCH: 31.6 pg (ref 26.0–34.0)
MCHC: 34.6 g/dL (ref 30.0–36.0)
MCV: 91.3 fL (ref 80.0–100.0)
Platelets: 141 10*3/uL — ABNORMAL LOW (ref 150–400)
RBC: 2.88 MIL/uL — ABNORMAL LOW (ref 3.87–5.11)
RDW: 14 % (ref 11.5–15.5)
WBC: 11.4 10*3/uL — ABNORMAL HIGH (ref 4.0–10.5)
nRBC: 0 % (ref 0.0–0.2)

## 2018-10-10 LAB — BASIC METABOLIC PANEL
Anion gap: 9 (ref 5–15)
BUN: 20 mg/dL (ref 8–23)
CO2: 23 mmol/L (ref 22–32)
Calcium: 8.6 mg/dL — ABNORMAL LOW (ref 8.9–10.3)
Chloride: 99 mmol/L (ref 98–111)
Creatinine, Ser: 1.36 mg/dL — ABNORMAL HIGH (ref 0.44–1.00)
GFR calc Af Amer: 41 mL/min — ABNORMAL LOW (ref >60)
GFR calc non Af Amer: 35 mL/min — ABNORMAL LOW (ref >60)
Glucose, Bld: 100 mg/dL — ABNORMAL HIGH (ref 70–99)
Potassium: 5.5 mmol/L — ABNORMAL HIGH (ref 3.5–5.1)
Sodium: 131 mmol/L — ABNORMAL LOW (ref 135–145)

## 2018-10-10 LAB — COMPREHENSIVE METABOLIC PANEL
ALT: 27 U/L (ref 0–44)
AST: 40 U/L (ref 15–41)
Albumin: 2.5 g/dL — ABNORMAL LOW (ref 3.5–5.0)
Alkaline Phosphatase: 132 U/L — ABNORMAL HIGH (ref 38–126)
Anion gap: 6 (ref 5–15)
BUN: 21 mg/dL (ref 8–23)
CO2: 25 mmol/L (ref 22–32)
Calcium: 9 mg/dL (ref 8.9–10.3)
Chloride: 100 mmol/L (ref 98–111)
Creatinine, Ser: 1.34 mg/dL — ABNORMAL HIGH (ref 0.44–1.00)
GFR calc Af Amer: 42 mL/min — ABNORMAL LOW (ref >60)
GFR calc non Af Amer: 36 mL/min — ABNORMAL LOW (ref >60)
Glucose, Bld: 147 mg/dL — ABNORMAL HIGH (ref 70–99)
Potassium: 5.7 mmol/L — ABNORMAL HIGH (ref 3.5–5.1)
Sodium: 131 mmol/L — ABNORMAL LOW (ref 135–145)
Total Bilirubin: 1.5 mg/dL — ABNORMAL HIGH (ref 0.3–1.2)
Total Protein: 6.2 g/dL — ABNORMAL LOW (ref 6.5–8.1)

## 2018-10-10 LAB — GLUCOSE, CAPILLARY
Glucose-Capillary: 85 mg/dL (ref 70–99)
Glucose-Capillary: 97 mg/dL (ref 70–99)
Glucose-Capillary: 201 mg/dL — ABNORMAL HIGH (ref 70–99)
Glucose-Capillary: 164 mg/dL — ABNORMAL HIGH (ref 70–99)
Glucose-Capillary: 142 mg/dL — ABNORMAL HIGH (ref 70–99)

## 2018-10-10 LAB — PROTIME-INR
INR: 1.3 — ABNORMAL HIGH (ref 0.8–1.2)
Prothrombin Time: 16.2 seconds — ABNORMAL HIGH (ref 11.4–15.2)

## 2018-10-10 MED ORDER — SODIUM ZIRCONIUM CYCLOSILICATE 10 G PO PACK
10.0000 g | PACK | Freq: Once | ORAL | Status: AC
  Administered 2018-10-10: 10 g via ORAL
  Filled 2018-10-10: qty 1

## 2018-10-10 MED ORDER — ENOXAPARIN SODIUM 40 MG/0.4ML ~~LOC~~ SOLN
40.0000 mg | Freq: Every day | SUBCUTANEOUS | Status: DC
  Administered 2018-10-11 – 2018-10-19 (×9): 40 mg via SUBCUTANEOUS
  Filled 2018-10-10 (×9): qty 0.4

## 2018-10-10 MED ORDER — SODIUM CHLORIDE 0.9 % IV BOLUS
1000.0000 mL | Freq: Once | INTRAVENOUS | Status: AC
  Administered 2018-10-10: 1000 mL via INTRAVENOUS

## 2018-10-10 MED ORDER — FUROSEMIDE 10 MG/ML IJ SOLN
20.0000 mg | Freq: Once | INTRAMUSCULAR | Status: AC
  Administered 2018-10-10: 20 mg via INTRAVENOUS
  Filled 2018-10-10: qty 2

## 2018-10-10 MED ORDER — INSULIN GLARGINE 100 UNIT/ML ~~LOC~~ SOLN
10.0000 [IU] | Freq: Every day | SUBCUTANEOUS | Status: DC
  Administered 2018-10-10: 10 [IU] via SUBCUTANEOUS
  Filled 2018-10-10 (×2): qty 0.1

## 2018-10-10 MED ORDER — FUROSEMIDE 10 MG/ML IJ SOLN
20.0000 mg | Freq: Every day | INTRAMUSCULAR | Status: DC
  Administered 2018-10-10: 20 mg via INTRAVENOUS
  Filled 2018-10-10: qty 2

## 2018-10-10 MED ORDER — SODIUM CHLORIDE 0.9 % IV SOLN
INTRAVENOUS | Status: DC
  Administered 2018-10-10: 1000 mL via INTRAVENOUS

## 2018-10-10 NOTE — Progress Notes (Signed)
   Subjective:  She is feeling well this morning although has some pain in her right leg occasionally. Denies abdominal pain or nausea.   Objective:  Vital signs in last 24 hours: Vitals:   10/09/18 1130 10/09/18 1732 10/09/18 2040 10/10/18 0423  BP: 113/90 136/74 122/70 130/78  Pulse: 80 88 86 88  Resp: 17 18 18 19   Temp: 99 F (37.2 C) 98.5 F (36.9 C) 99.2 F (37.3 C) 98.9 F (37.2 C)  TempSrc: Oral Oral Oral Oral  SpO2: 95% 100% 95% 98%  Weight:      Height:       Constitution: NAD, supine in bed Cardio: RRR, no m/r/g  Respiratory: non-labored breathing, on RA  Abdominal: soft, non-distended, NTTP  MSK:  LLE +1, RLE +2 pitting edema with erythema extending up to the knee and darkening lateral calf with hematoma, no crepitus, TTP Neuro: alert and oriented, normal affect  Assessment/Plan:  Principal Problem:   Recurrent cellulitis of lower extremity Active Problems:   Liver cirrhosis secondary to NASH (HCC)   Seizure (HCC)  Brandy Anderson is a 85yo female with PMH TIIDM, HTN, CKD (stage not yet known here), bipolar disorder seizures, acquired hypothyroidism, liver cirrhosis with varices, history of GI hemorrhage and lower extremity cellulitis presenting from [facility] with altered mental status and RLE pain, swelling and redness. Daughter, Ron Parker, [phone]  Right Lower Extremity Cellulitis RLE cellulitis previously failing doxycycline and bactrim at her SNF with worsening LE pain and erythema. Cellulitis appears to be improving on vancomycin.   - cont. Vancomycin  - wound care consulted  - am CBC  Hepatic Encephalopathy Liver Cirrhosis NASH Right upper quadrant [place] yesterday showed no acute findings or ascites. MELD 22. Was intermittently refusing lactulose at her facility but is taking it now and having BM   - continue lactulose 10 g TID, rifaximin 550 mg bid  - continue home dose lasix 20mg  qd - hold aldactone today for hyperkalemia  - am  CMP, Mg    Electrolyte Abnormalities  Hyponatremia 2/2 her cirrhosis, hyperkalemia likely 2/2 to her spironolactone which is 100 mg qd compared to lasix 20 mg qd. Discussing with facility she was previously on lasix 20 mg bid but was refusing her evening dose. Received 2g Cagluconate total, lasix, insulin + D50, and lokelma.   - hold aldactone for hyperkalemia, cont. Lasix - consider d/c with lasix 40mg  qd - optimize diuretics prior to discharge   Chronic Systolic Heart Failure  - continue home nadolol 20 mg qd  Type II DM Hypoglycemia Hypoglycemia at admission 2/2 taking her insulin without eating dinner. She states she usually eats breakfast but does not like to eat dinner and lunch. Glucose normal on low SSI.  Normally takes lantus 35U qhs and humalog 12U before meals.   - cont. SSI sensitive - SSI qhs  Borderline Personality Disorder Per daughter Boneta [facility] patient has a history of psychiatric issues, including personality disorders.   - continue abilify 15 mg qd, cymbalta 60 mg qd  VTE: lovenox IVF: none Diet: carb modified  Code: DNR  Dispo: Anticipated discharge pending improvement in cellulitis.   Guinevere Scarlet A, DO 10/10/2018, 5:57 AM Pager: [phone]

## 2018-10-10 NOTE — Discharge Summary (Addendum)
Name: Morayo Leven MRN: [id number] DOB: 10/27/1930 85 y.o. PCP: Teodoro Spray, MD  Date of Admission: 10/09/2018  3:36 AM Date of Discharge: 10/15/2018 Attending Physician: Inez Catalina, MD  Discharge Diagnosis: 1. Right Lower Extremity Cellulitis 2. NASH Cirrhosis  3. Hyperkalemia 4. Chronic Systolic Heart Failure  5. Type II Diabetes 6. Thrombocytopenia 7. Anemia 8. Acute Kidney Injury on CKD  Discharge Medications: Allergies as of 10/19/2018      Reactions   Vancomycin Itching, Rash, Swelling   Codeine Nausea And Vomiting   Sick   Pioglitazone Other (See Comments)   Edema   Ezetimibe    Myalgias   Pravachol  [pravastatin]    Myalgias   Zoloft  [sertraline]    Imbalance   Amoxicillin-pot Clavulanate Diarrhea, Nausea And Vomiting      Medication List    STOP taking these medications   doxycycline 100 MG capsule Commonly known as:  MONODOX   sulfamethoxazole-trimethoprim 800-160 MG tablet Commonly known as:  BACTRIM DS     TAKE these medications   acetaminophen 325 MG tablet Commonly known as:  TYLENOL Take 650 mg by mouth every 6 (six) hours as needed for mild pain.   albuterol 108 (90 Base) MCG/ACT inhaler Commonly known as:  VENTOLIN HFA Inhale 2 puffs into the lungs 2 (two) times daily as needed for wheezing or shortness of breath.   ARIPiprazole 15 MG tablet Commonly known as:  ABILIFY Take 15 mg by mouth at bedtime.   aspirin EC 81 MG tablet Take 81 mg by mouth daily.   benzonatate 100 MG capsule Commonly known as:  TESSALON Take 100 mg by mouth daily as needed for cough.   DULoxetine 60 MG capsule Commonly known as:  CYMBALTA Take 60 mg by mouth at bedtime.   ferrous sulfate 325 (65 FE) MG tablet Take 325 mg by mouth daily with breakfast.   furosemide 20 MG tablet Commonly known as:  LASIX Take 20 mg by mouth every morning.   hydroxypropyl methylcellulose / hypromellose 2.5 % ophthalmic solution Commonly known as:  ISOPTO  TEARS / GONIOVISC Place 1 drop into both eyes daily as needed for dry eyes.   ibuprofen 200 MG tablet Commonly known as:  ADVIL Take 200 mg by mouth every 6 (six) hours as needed for mild pain.   insulin glargine 100 UNIT/ML injection Commonly known as:  LANTUS Inject 0.1 mLs (10 Units total) into the skin at bedtime. What changed:  how much to take   insulin lispro 100 UNIT/ML injection Commonly known as:  HUMALOG Inject 12 Units into the skin See admin instructions. Inject 12 units under the skin before the meals. Can give additional 8 units as needed for CBG's greater than 400 mg/dl   isosorbide mononitrate 30 MG 24 hr tablet Commonly known as:  IMDUR Take 30 mg by mouth every morning.   lactulose 10 GM/15ML solution Commonly known as:  CHRONULAC Take 30 g by mouth 2 (two) times daily.   levETIRAcetam 500 MG tablet Commonly known as:  KEPPRA Take 500 mg by mouth 2 (two) times daily.   levothyroxine 88 MCG tablet Commonly known as:  SYNTHROID Take 88 mcg by mouth daily before breakfast.   magnesium oxide 400 MG tablet Commonly known as:  MAG-OX Take 400 mg by mouth daily.   metFORMIN 500 MG tablet Commonly known as:  GLUCOPHAGE Take 500 mg by mouth at bedtime.   nadolol 20 MG tablet Commonly known as:  CORGARD Take 20  mg by mouth daily.   ondansetron 4 MG tablet Commonly known as:  ZOFRAN Take 4 mg by mouth every 8 (eight) hours as needed for nausea or vomiting.   phenol 1.4 % Liqd Commonly known as:  CHLORASEPTIC Use as directed 1 spray in the mouth or throat as needed for throat irritation / pain.   rifaximin 550 MG Tabs tablet Commonly known as:  XIFAXAN Take 550 mg by mouth 2 (two) times daily.   sennosides-docusate sodium 8.6-50 MG tablet Commonly known as:  SENOKOT-S Take 1 tablet by mouth 2 (two) times daily as needed for constipation.   spironolactone 50 MG tablet Commonly known as:  ALDACTONE Take 1 tablet (50 mg total) by mouth daily. What  changed:    medication strength  how much to take       Disposition and follow-up:   Ms.Jailen Scotty CourtStafford [facility] St[facility].  At the hospital follow up visit please address:  1. Right Lower Extremity Cellulitis: failed doxycycline and bactrim. Treated with 10 days Vancomycin. Ortho consulted for 11cm phlegmon lateral calf and recommended medical therapy. Continuing to resolve, recommend consultation if does not resorb.   2. NASH Cirrhosis: stable at discharge. Took most of her lactulose except for one day. Discharged with lasix 20 mg qd and spironolactone decreased to 50 mg qd to have usual lasix/spiro ratio. 3. Hyperkalemia: 6.1 at admission. Held spiro and treated with Ca gluconate, lokelma. Thought to be secondary to her spironolactone and/or bactrim. Discharged with decreased dose as above.  5. Type II Diabetes: did not require her lantus initially due to hypoglycemia from poor po intake. Improved by discharge. Home Lantus is 35U qhs and novolog 12U TID. At discharge she was on 10U lantus qhs with moderate sliding scale.  6. Thrombocytopenia: 2/2 cirrhosis. Stable.  7. Anemia: Hgb 8-10 during admission. iron, TSAT low despite being on iron therapy. Possibly 2/2 CKD vs. Cirrhosis. Recommend repeat CBC.  8. Systolic CHF: discharged with lasix 20 mg qd and spironolactone 50 mg qd. May need intermittent increased doses of lasix.   2.  Labs / imaging needed at time of follow-up: CMP, CBC   3.  Pending labs/ test needing follow-up: none   Follow-up Appointments:  Hospital Course by problem list: 1. Right Lower Extremity Cellulitis Lucila Mainethel Feeback is a 85yo female with PMH TIIDM, HTN, AKI on CKD (stage here not known), bipolar disorder, seizures, acquired hypothyroidism, NASH liver cirrhosis with varices, history of GI hemorrhage and lower extremity cellulitis [facility] with altered mental status and RLE pain, swelling  and redness, and hypothermia and diagnosed with cellulitis with an area to the lateral calf with darkening but not crepitus. She had no leukocytosis. Blood cultures were negative. She was admitted for treatment with IV antibiotics. In the outpatient setting she had failed doxycycline and bactrim po at her facility. D[facility] and [facility] with no [facility]. CT leg was done and showed no abscess but a phlegmon 11cm long. This was discussed with orthopedics and it was thought she would not require surgery and that this would resolve slowly with her antibiotics. She was placed on IV Vancomycin 750 mg q24h for 10 days with resolution of her cellulitis. She does still have LE skin discoloration. If phlegmon does not resolve would recommend repeat orthopedic consult.    [place] Cirrhosis Thrombocytopenia Chronic Systolic Heart Failure Hyperkalemia Hyponatremia AKI on CKD Stage III Diuretics held at  admission due to AMS. She was additionally hyperkalemic, which was likely secondary to her bactrim and spironolactone. Per [facility], spironolactone was held but unsure when. No acute EKG findings were present. She was given lokelma, Ca gluconate, D50 and insulin with resolution of her hyperkalemia over several days requiring an extra dose of lokelma. She was continued on her lactulose and rifaximin and cirrhosis remained stable. Abdominal US was done due to distention, which showed mild ascites. She was continued on spironolactone 50 mg qd and lasix 20 mg qd. She required increased dose after initially holding these medications 2/2 LE edema. This negatively impacted her kidney function and would recommend continuing at lasix 20 mg qd and spironolactone 50 mg qd and may need intermittent extra doses of lasix.   6. Type II DM Hypoglycemic at admission. Per patient she often does not eat lunch and dinner. Per chart review she was taking 35U lantus qhs and novolog 12U tid at her  facility. Appetite improved with treatment of cellulitis. She was increased to lantus 10U prior to discharge in addition to her sliding scale and 2U novolog with meals.   Discharge Vitals:   BP (!) 131/51 (BP Location: Left Arm)    Pulse 77    Temp 98.5 F (36.9 C) (Oral)    Resp 20    Ht 5\' 5"  (1.651 m)    Wt 89.7 kg    SpO2 99%    BMI 32.91 kg/m   Pertinent Labs, Studies, and Procedures:   Limited Abdominal US IMPRESSION: Fairly mild amount of ascites noted in the abdomen, somewhat more on the right than on the left. Electronically Signed   By: Bretta Bang III M.D.   On: 10/17/2018 09:17  10/09/2018 Abdominal RUQ US IMPRESSION: Cirrhosis.  No focal hepatic lesion is seen. Status post cholecystectomy. Electronically Signed   By: Charline Bills M.D.   On: 10/09/2018 11:21  10/08/2018 CT IMPRESSION: Chronic small vessel ischemia without acute intracranial abnormality. Electronically Signed   By: Deatra Robinson M.D.   On: 10/09/2018 04:41   CXR 10/08/2018 IMPRESSION: No active disease. Electronically Signed   By: Deatra Robinson M.D.   On: 10/09/2018 04:25  Discharge Instructions: Discharge Instructions    Diet - low sodium heart healthy   Complete by:  As directed    Discharge instructions   Complete by:  As directed    Increase activity slowly   Complete by:  As directed       Signed: Versie Starks, DO 10/19/2018, 2:23 PM   Pager: [phone]

## 2018-10-10 NOTE — Progress Notes (Signed)
Internal Medicine Teaching Service Attending:   I saw and examined the patient. I reviewed the resident's note and I agree with the resident's findings and plan as documented in the resident's note.  Principal Problem:   Cellulitis of right lower extremity Active Problems:   Liver cirrhosis secondary to NASH [facility])  Hospital day #2 for this 85 year old person living with NASH cirrhosis admitted with a severe nonpurulent cellulitis of the right lower extremity which is improving with IV vancomycin.  Plan to continue with another day of IV antibiotics before trying to switch to an oral agent.  Blood cultures are pending given her immunosuppression with cirrhosis.  Chronic liver disease is well compensated today, no signs of encephalopathy, appears euvolemic and we are slowly restarting diuretics for ascites prevention.  Brandy Hong, MD FACP

## 2018-10-10 NOTE — Consult Note (Signed)
WOC Nurse wound consult note Reason for Consult: Severe cellulitis to RLE following trauma, full thickness wound to left pretibial area on left LE Wound type: Trauma, infection Pressure Injury POA: N/A Measurement:  Right LE: Area of resolving erythema, edema and purple discoloration to right LE (> lateral and posterior aspect) that extended to posterior thigh. Still painful to palpation, elevated on pillow. No open areas noted. LE is warmer than left, not indurated. Left LE with pretibial full thickness wound measuring 2cm x 3cm x 0.4cm with 80% red, 20% yellow wound bed and small amount of light yellow exudate. Periwound skin is intact and with mild edema, no warmth. Wound bed:As described above Drainage (amount, consistency, odor) As described above Periwound:As described above Dressing procedure/placement/frequency: I will not add a topical to the RLE.  The LLE will require a twice daily dressing (I have provided Nursing guidance for xeroform gauze, an antimicrobial and astringent) that will continue upon discharge. Elevation of the RLE with follow up and monitoring of the resolving condition (cellulitis) is required by her PCP or the facility [facility]) Medical staff. As her LE will be elevated, I will provide a pressure redistribution chair pad for here as well as after discharge.   WOC nursing team will not follow, but will remain available to this patient, the nursing and medical teams.  Please re-consult if needed. Thanks, Ladona Mow, MSN, RN, GNP, [place]  Pager# [phone]

## 2018-10-11 DIAGNOSIS — N179 Acute kidney failure, unspecified: Secondary | ICD-10-CM

## 2018-10-11 DIAGNOSIS — I8510 Secondary esophageal varices without bleeding: Secondary | ICD-10-CM

## 2018-10-11 DIAGNOSIS — D696 Thrombocytopenia, unspecified: Secondary | ICD-10-CM

## 2018-10-11 DIAGNOSIS — K7460 Unspecified cirrhosis of liver: Secondary | ICD-10-CM

## 2018-10-11 DIAGNOSIS — R251 Tremor, unspecified: Secondary | ICD-10-CM

## 2018-10-11 LAB — COMPREHENSIVE METABOLIC PANEL
ALT: 27 U/L (ref 0–44)
AST: 44 U/L — ABNORMAL HIGH (ref 15–41)
Albumin: 2.8 g/dL — ABNORMAL LOW (ref 3.5–5.0)
Alkaline Phosphatase: 135 U/L — ABNORMAL HIGH (ref 38–126)
Anion gap: 10 (ref 5–15)
BUN: 19 mg/dL (ref 8–23)
CO2: 23 mmol/L (ref 22–32)
Calcium: 8.8 mg/dL — ABNORMAL LOW (ref 8.9–10.3)
Chloride: 97 mmol/L — ABNORMAL LOW (ref 98–111)
Creatinine, Ser: 1.45 mg/dL — ABNORMAL HIGH (ref 0.44–1.00)
GFR calc Af Amer: 38 mL/min — ABNORMAL LOW (ref >60)
GFR calc non Af Amer: 33 mL/min — ABNORMAL LOW (ref >60)
Glucose, Bld: 127 mg/dL — ABNORMAL HIGH (ref 70–99)
Potassium: 5.3 mmol/L — ABNORMAL HIGH (ref 3.5–5.1)
Sodium: 130 mmol/L — ABNORMAL LOW (ref 135–145)
Total Bilirubin: 2.2 mg/dL — ABNORMAL HIGH (ref 0.3–1.2)
Total Protein: 7 g/dL (ref 6.5–8.1)

## 2018-10-11 LAB — CBC
HCT: 29.7 % — ABNORMAL LOW (ref 36.0–46.0)
Hemoglobin: 10 g/dL — ABNORMAL LOW (ref 12.0–15.0)
MCH: 30.9 pg (ref 26.0–34.0)
MCHC: 33.7 g/dL (ref 30.0–36.0)
MCV: 91.7 fL (ref 80.0–100.0)
Platelets: 174 10*3/uL (ref 150–400)
RBC: 3.24 MIL/uL — ABNORMAL LOW (ref 3.87–5.11)
RDW: 13.9 % (ref 11.5–15.5)
WBC: 10.5 10*3/uL (ref 4.0–10.5)
nRBC: 0 % (ref 0.0–0.2)

## 2018-10-11 LAB — LEVETIRACETAM LEVEL: Levetiracetam Lvl: 36.6 ug/mL (ref 10.0–40.0)

## 2018-10-11 LAB — GLUCOSE, CAPILLARY
Glucose-Capillary: 56 mg/dL — ABNORMAL LOW (ref 70–99)
Glucose-Capillary: 115 mg/dL — ABNORMAL HIGH (ref 70–99)
Glucose-Capillary: 81 mg/dL (ref 70–99)

## 2018-10-11 LAB — MAGNESIUM: Magnesium: 1.5 mg/dL — ABNORMAL LOW (ref 1.7–2.4)

## 2018-10-11 MED ORDER — INSULIN GLARGINE 100 UNIT/ML ~~LOC~~ SOLN
15.0000 [IU] | Freq: Every day | SUBCUTANEOUS | Status: DC
  Filled 2018-10-11: qty 0.15

## 2018-10-11 MED ORDER — MAGNESIUM SULFATE 2 GM/50ML IV SOLN
2.0000 g | Freq: Once | INTRAVENOUS | Status: AC
  Administered 2018-10-11: 2 g via INTRAVENOUS
  Filled 2018-10-11: qty 50

## 2018-10-11 MED ORDER — VANCOMYCIN HCL IN DEXTROSE 750-5 MG/150ML-% IV SOLN
750.0000 mg | INTRAVENOUS | Status: AC
  Administered 2018-10-12 – 2018-10-18 (×7): 750 mg via INTRAVENOUS
  Filled 2018-10-11 (×8): qty 150

## 2018-10-11 MED ORDER — INSULIN GLARGINE 100 UNIT/ML ~~LOC~~ SOLN: 10.0000 [IU] | Freq: Every day | SUBCUTANEOUS | Status: DC

## 2018-10-11 MED ORDER — FERROUS SULFATE 325 (65 FE) MG PO TABS
325.0000 mg | ORAL_TABLET | Freq: Every day | ORAL | Status: DC
  Administered 2018-10-11 – 2018-10-19 (×9): 325 mg via ORAL
  Filled 2018-10-11 (×9): qty 1

## 2018-10-11 NOTE — Progress Notes (Addendum)
Pharmacy Antibiotic Note  Brandy Anderson is a 85 y.o. female admitted on 10/09/2018 with multiple comorbidities. Starting antibiotics for cellulitis. The patient is noted to have an allergy listed to Vancomycin however has been tolerating. Pharmacy consulted to dose.   SCr 1.45, CrCl~30 ml/min. Will reduce the Vancomycin dose slightly today.   Vancomycin 750 mg IV Q 24 hrs. Goal AUC 400-550. Expected AUC: 448.9 SCr used: 1.45   Plan: - Reduce Vancomycin to 750 mg IV every 24 hours - Will continue to follow renal function, culture results, LOT, and antibiotic de-escalation plans    Height: 5\' 5"  (165.1 cm) Weight: 200 lb (90.7 kg) IBW/kg (Calculated) : 57  Temp (24hrs), Avg:98 F (36.7 C), Min:97.6 F (36.4 C), Max:98.5 F (36.9 C)  Recent Labs  Lab 10/09/18 0409 10/09/18 0641 10/09/18 1522 10/10/18 0008 10/10/18 0625 10/11/18 0318  WBC 10.4  --   --  11.4*  --  10.5  CREATININE 1.62*  --  1.36* 1.34* 1.36* 1.45*  LATICACIDVEN 2.2* 2.4*  --   --   --   --       Antimicrobials this admission: 5/16 vancomycin > 5/16 cefepime x1  Dose adjustments this admission: N/A  Microbiology results: 5/16 Bcx >> ngtd Covid negative  Thank you for allowing pharmacy to be a part of this patient's care.  Georgina Pillion, PharmD, BCPS Clinical Pharmacist Clinical phone for 10/11/2018: [phone] 10/11/2018 12:48 PM   **Pharmacist phone directory can now be found on [email or web address] ([email or web address]).  Listed under [facility].

## 2018-10-11 NOTE — Progress Notes (Signed)
  Date: 10/11/2018  Patient name: Brandy Anderson  Medical record number: [id number]  Date of birth: [birth year]   I have seen and evaluated this patient and I have discussed the plan of care with the house staff. Please see Dr. Al Decant note for complete details. I concur with her findings and plan.     Inez Catalina, MD 10/11/2018, 12:52 PM

## 2018-10-11 NOTE — Evaluation (Signed)
Occupational Therapy Evaluation Patient Details Name: Brandy Anderson MRN: [id number] DOB: Dec 12, 1930 Today's Date: 10/11/2018    History of Present Illness 85 year old person living with NASH cirrhosis admitted with a severe nonpurulent cellulitis of the right lower extremity which is improving with IV vancomycin.   Clinical Impression   This 85 y/o female presents with the above. At baseline pt residing in SNF, reports using wheelchair and RW for mobility, receives assist from staff for ADL. Pt presenting with impaired cognition, decreased sitting/standing balance, generalized weakness. Pt requiring maxA+2 for sit<>stand and stand pivot transfer using RW. She currently requires minA for seated UB ADL, max-totalA for LB ADL. Pt overall following commands given multimodal cues, though notable confusion during session. She will benefit from continued acute OT services and recommend pt return to SNF facility for continued OT services to maximize her safety and independence with ADL and mobility. Will follow.     Follow Up Recommendations  SNF;Supervision/Assistance - 24 hour(recommend pt return to her current SNF facility)    Equipment Recommendations  Other (comment)(TBA)           Precautions / Restrictions Precautions Precautions: Fall Precaution Comments: Watch for urinary incontinence Restrictions Weight Bearing Restrictions: No      Mobility Bed Mobility Overal bed mobility: Needs Assistance Bed Mobility: Supine to Sit     Supine to sit: Max assist;+2 for safety/equipment;+2 for physical assistance     General bed mobility comments: assist for LEs and trunk elevation, cues to assist with scooting hips towards EOB  Transfers Overall transfer level: Needs assistance Equipment used: Rolling walker (2 wheeled) Transfers: Sit to/from [employer] Sit to Stand: Max assist;+2 physical assistance;+2 safety/equipment Stand pivot transfers: Max assist;+2 physical  assistance;+2 safety/equipment       General transfer comment: boosting and steadying assist to rise to RW, blocked bil feet as pt reports feeling as if they are sliding with attempts to stand, pt stood x2 from EOB; cues for maintaining upright posture; cues for sequencing steps to turn towards recliner with additional assist for RW management    Balance Overall balance assessment: Needs assistance Sitting-balance support: Feet supported;Bilateral upper extremity supported Sitting balance-Leahy Scale: Poor Sitting balance - Comments: occasional posterior lean requires intermittent MinA to maintain balance Postural control: Posterior lean Standing balance support: Bilateral upper extremity supported;During functional activity Standing balance-Leahy Scale: Poor Standing balance comment: reliant on UE support and external assist                           ADL either performed or assessed with clinical judgement   ADL Overall ADL's : Needs assistance/impaired Eating/Feeding: Set up;Sitting   Grooming: Min guard;Set up;Sitting Grooming Details (indicate cue type and reason): supported sitting Upper Body Bathing: Minimal assistance;Sitting   Lower Body Bathing: Maximal assistance;+2 for physical assistance;+2 for safety/equipment;Sit to/from stand   Upper Body Dressing : Minimal assistance;Sitting   Lower Body Dressing: Maximal assistance;+2 for physical assistance;+2 for safety/equipment;Sit to/from stand Lower Body Dressing Details (indicate cue type and reason): requires assist to doff/don clean socks today as pt unable to reach her LEs Toilet Transfer: Maximal assistance;+2 for physical assistance;+2 for safety/equipment;Stand-pivot;RW Toilet Transfer Details (indicate cue type and reason): simulated via transfer to recliner Toileting- Clothing Manipulation and Hygiene: Total assistance;+2 for physical assistance;+2 for safety/equipment;Sit to/from stand Toileting -  Clothing Manipulation Details (indicate cue type and reason): requires assist for peri-care in standing; pt incontinent upon standing  Functional mobility during ADLs: Maximal assistance;+2 for physical assistance;+2 for safety/equipment;Rolling walker(stand pivot transfers) General ADL Comments: pt with decreased sitting/standing balance, weakness, and impaired cognition     Vision         Perception     Praxis      Pertinent Vitals/Pain Pain Assessment: Faces Faces Pain Scale: Hurts little more Pain Location: RLE Pain Descriptors / Indicators: Aching Pain Intervention(s): Monitored during session;Repositioned(elevated LE)     Hand Dominance     Extremity/Trunk Assessment Upper Extremity Assessment Upper Extremity Assessment: Generalized weakness   Lower Extremity Assessment Lower Extremity Assessment: Defer to PT evaluation RLE Deficits / Details: Noting erythema and swelling; decr tolerance of weight bearing RLE during transfers       Communication Communication Communication: No difficulties   Cognition Arousal/Alertness: Awake/alert Behavior During Therapy: WFL for tasks assessed/performed Overall Cognitive Status: No family/caregiver present to determine baseline cognitive functioning Area of Impairment: Awareness;Problem solving                           Awareness: Intellectual Problem Solving: Difficulty sequencing;Requires verbal cues;Requires tactile cues General Comments: pt with some nonsensical speech initially at start of session, oriented to current location; follows simple commands given multimodal cues to do so; overall waxing/waning awareness and cognitive status   General Comments       Exercises     Shoulder Instructions      Home Living Family/patient expects to be discharged to:: Skilled nursing facility                                        Prior Functioning/Environment Level of Independence: Needs  assistance  Gait / Transfers Assistance Needed: Tells us she uses a RW sometimes, but mostly uses wheelchair for mobility, someone helps push her ADL's / Homemaking Assistance Needed: Assist from SNF staff            OT Problem List: Decreased strength;Decreased range of motion;Decreased activity tolerance;Impaired balance (sitting and/or standing);Decreased cognition;Decreased safety awareness;Obesity      OT Treatment/Interventions: Self-care/ADL training;Therapeutic exercise;Neuromuscular education;Therapeutic activities;Patient/family education;Balance training;Cognitive remediation/compensation    OT Goals(Current goals can be found in the care plan section) Acute Rehab OT Goals Patient Stated Goal: none stated, pt happy to be up OOB OT Goal Formulation: With patient Time For Goal Achievement: 10/25/18 Potential to Achieve Goals: Good  OT Frequency: Min 2X/week   Barriers to D/C:            Co-evaluation PT/OT/SLP Co-Evaluation/Treatment: Yes Reason for Co-Treatment: For patient/therapist safety;To address functional/ADL transfers PT goals addressed during session: Mobility/safety with mobility OT goals addressed during session: ADL's and self-care      AM-PAC OT "6 Clicks" Daily Activity     Outcome Measure Help from another person eating meals?: None Help from another person taking care of personal grooming?: A Little Help from another person toileting, which includes using toliet, bedpan, or urinal?: Total Help from another person bathing (including washing, rinsing, drying)?: A Lot Help from another person to put on and taking off regular upper body clothing?: A Lot Help from another person to put on and taking off regular lower body clothing?: Total 6 Click Score: 13   End of Session Equipment Utilized During Treatment: Gait belt;Rolling walker Nurse Communication: Mobility status;Need for lift equipment(to use [employer])  Activity Tolerance: Patient tolerated  treatment  well Patient left: in chair;with call bell/phone within reach;with chair alarm set  OT Visit Diagnosis: Muscle weakness (generalized) (M62.81);Unsteadiness on feet (R26.81)                Time: 0935-1006 OT Time Calculation (min): 31 min Charges:  OT General Charges $OT Visit: 1 Visit OT Evaluation $OT Eval Moderate Complexity: 1 Mod  Marcy Siren, OT [employer] Pager [phone] Office [phone]   Orlando Penner 10/11/2018, 10:40 AM

## 2018-10-11 NOTE — Progress Notes (Signed)
Subjective:   Brandy Anderson was examined and evaluated at bedside this AM. She was AAOx3 to year, name and location but appears to have some situational confusion thinking she called the ambulance and was driven here yesterday. She states she had difficulty getting out of bed last night and requests assistance with that. She mentions that her leg pain has improved and the swelling also looks better. She endorses some constipation. Denies any abdominal pain, diarrhea, nausea or vomiting.  Objective:  Vital signs in last 24 hours: Vitals:   10/10/18 0741 10/10/18 1611 10/10/18 2046 10/11/18 0512  BP: (!) 124/53 (!) 154/62 128/68 (!) 142/55  Pulse: 81 72 77 80  Resp: '18 20 18 18  ' Temp: 98.4 F (36.9 C) 98.5 F (36.9 C) 98 F (36.7 C) 97.6 F (36.4 C)  TempSrc: Oral Oral Oral Oral  SpO2:  100% 100% 100%  Weight:      Height:       Constitution: NAD, sitting up in bed  Cardio: RRR, no m/r/g  Respiratory: CTA, no w/r/r  Abdominal: +BS, distended but soft, no fluid wave, NTTP  MSK: trace LE edema, RLE +1 pitting edema with erythema extending up to the knee and posteriolateral hematoma coloration improved from yesterday, no crepitus, NTTP, +tremor Neuro: a&ox3 but appears confused her situation, normal affect, pleasant  Skin: otherwise CTA    Assessment/Plan:  Principal Problem:   Cellulitis of right lower extremity Active Problems:   Liver cirrhosis secondary to NASH ([place])  Brandy Anderson is a 85yo female with PMH TIIDM, HTN,CKD (stage not yet known here), bipolar disorderseizures, acquired hypothyroidism, liver cirrhosis with varices, history of GI hemorrhage and lower extremity cellulitis presenting from [facility] with altered mental statusand RLE pain, swelling and redness.Daughter, Brandy Anderson, [phone]  Right Lower Extremity Cellulitis Continues to improve on vancomycin. Area of hematoma is also improving, has been without crepitus. Previously had doppler  US at her facility around 5/8 but will consider repeating this to check for abscess. As it currently appears to be healing it may be bruising secondary to her cirrhosis and intermittent thrombocytopenia. Transition to PO as she continues to improve will be difficult as she failed doxy and bactrim, has a history of C. Diff and would not recommend clinda, and should not take linzolid with her cirrhosis and intermittent thrombocytopenia.   - continue IV vanc  - am CBC   Hepatic Encephalopathy  Liver Cirrhosis NASH Compensated, no ascites. Alk phos and AST mildly elevated today. A&Ox3 with some confusion but appears at baseline.   - cont. Lactulose 10 g TID and rifaximin 550 mg bid with goal of at least 3 BM per day  - holding lasix and spironolactone today  - am CMP   Electrolyte Abnormalities  Hyperkalemia, Hypomagnesemia, Hyponatremia  K trending down, hyperkalemia likely secondary to her spironolactone and CKD. Hypomagnesemia secondary to diuresis. Hyponatremia 2/2 cirrhosis. AM cortisol normal at admission.   - repleting Mg  - am CMP   Chronic Systolic Heart Failure  - cont. Home nadolol 20 mg qd  Type II DM Hypoglycemia - resolved Normally takes lantus 35U qhs and humalog 12U before meals. Glucose slightly elevated today.   - increase to Lantus 15U - cont. SSI sensitive - SSI qhs  Borderline personality Disorder  - continue abilify 15 mg qd, cymbalta 60 mg qd   VTE: lovenox  IVF: none  Diet: carb modified  Code: DNR  Dispo: Anticipated discharge pending improvement in cellulitis.   Brandy Anderson  A, DO 10/11/2018, 6:51 AM Pager: [phone]

## 2018-10-11 NOTE — Progress Notes (Signed)
Inpatient Diabetes Program Recommendations  AACE/ADA: New Consensus Statement on Inpatient Glycemic Control (2015)  Target Ranges:  Prepandial:   less than 140 mg/dL      Peak postprandial:   less than 180 mg/dL (1-2 hours)      Critically ill patients:  140 - 180 mg/dL   Lab Results  Component Value Date   GLUCAP 56 (L) 10/11/2018   HGBA1C 5.5 10/09/2018    Review of Glycemic Control  Diabetes history: DM 2 Outpatient Diabetes medications: Lantus 35 units qhs, Humalog 12 units tid, additional 8 units for CBGs >400 mg/dl, Metformin 950 mg qhs  Current orders for Inpatient glycemic control:  Lantus 15 units, Novolog 0-9 units tid, Novolog 0-5 units qhs  Inpatient Diabetes Program Recommendations:    Called Dr. Cleaster Corin to inform of glucose 56 at lunchtime. Dr. Cleaster Corin is changing insulin orders. Will continue to monitor glucose trends while inpatient.  Thanks,  Christena Deem RN, MSN, BC-ADM Inpatient Diabetes Coordinator Team Pager [phone] (8a-5p)

## 2018-10-11 NOTE — Evaluation (Signed)
Physical Therapy Evaluation Patient Details Name: Brandy Anderson MRN: [id number] DOB: [birth year] Today's Date: 10/11/2018   History of Present Illness  85 year old person living with NASH cirrhosis admitted with a severe nonpurulent cellulitis of the right lower extremity which is improving with IV vancomycin.  Clinical Impression   Pt admitted with above diagnosis. Pt currently with functional limitations due to the deficits listed below (see PT Problem List). At baseline pt residing in SNF, reports using wheelchair and RW for mobility, receives assist from staff for ADL. Pt presenting with impaired cognition, decreased sitting/standing balance, generalized weakness. Pt requiring maxA+2 for sit<>stand and stand pivot transfer using RW; Agree with dc back to SNF, and for her to receive continuing therapy services;  Pt will benefit from skilled PT to increase their independence and safety with mobility to allow discharge to the venue listed below.       Follow Up Recommendations SNF    Equipment Recommendations  Other (comment)(defer to SNF)    Recommendations for Other Services       Precautions / Restrictions Precautions Precautions: Fall Precaution Comments: Watch for urinary incontinence Restrictions Weight Bearing Restrictions: No      Mobility  Bed Mobility Overal bed mobility: Needs Assistance Bed Mobility: Supine to Sit     Supine to sit: Max assist;+2 for safety/equipment;+2 for physical assistance     General bed mobility comments: assist for LEs and trunk elevation, cues to assist with scooting hips towards EOB  Transfers Overall transfer level: Needs assistance Equipment used: Rolling walker (2 wheeled) Transfers: Sit to/from [employer] Sit to Stand: Max assist;+2 physical assistance;+2 safety/equipment Stand pivot transfers: Max assist;+2 physical assistance;+2 safety/equipment       General transfer comment: boosting and steadying assist  to rise to RW, blocked bil feet as pt reports feeling as if they are sliding with attempts to stand, pt stood x2 from EOB; cues for maintaining upright posture; cues for sequencing steps to turn towards recliner with additional assist for RW management  Ambulation/Gait                Stairs            Wheelchair Mobility    Modified Rankin (Stroke Patients Only)       Balance Overall balance assessment: Needs assistance Sitting-balance support: Feet supported;Bilateral upper extremity supported Sitting balance-Leahy Scale: Poor Sitting balance - Comments: occasional posterior lean requires intermittent MinA to maintain balance Postural control: Posterior lean Standing balance support: Bilateral upper extremity supported;During functional activity Standing balance-Leahy Scale: Poor Standing balance comment: reliant on UE support and external assist                             Pertinent Vitals/Pain Pain Assessment: Faces Faces Pain Scale: Hurts little more Pain Location: RLE Pain Descriptors / Indicators: Aching Pain Intervention(s): Monitored during session;Repositioned(elevated LE)    Home Living Family/patient expects to be discharged to:: Skilled nursing facility                      Prior Function Level of Independence: Needs assistance   Gait / Transfers Assistance Needed: Tells [place] she uses a RW sometimes, but mostly uses wheelchair for mobility, someone helps push her  ADL's / Homemaking Assistance Needed: Assist from SNF staff        Hand Dominance        Extremity/Trunk Assessment   Upper Extremity Assessment Upper  Extremity Assessment: Generalized weakness    Lower Extremity Assessment Lower Extremity Assessment: Generalized weakness;RLE deficits/detail RLE Deficits / Details: Noting erythema and swelling; decr tolerance of weight bearing RLE during transfers; limited ankle dorsiflexion due to swelling and pain        Communication   Communication: No difficulties  Cognition Arousal/Alertness: Awake/alert Behavior During Therapy: WFL for tasks assessed/performed Overall Cognitive Status: No family/caregiver present to determine baseline cognitive functioning Area of Impairment: Awareness;Problem solving                           Awareness: Intellectual Problem Solving: Difficulty sequencing;Requires verbal cues;Requires tactile cues General Comments: pt with some nonsensical speech initially at start of session, oriented to current location; follows simple commands given multimodal cues to do so; overall waxing/waning awareness and cognitive status      General Comments      Exercises     Assessment/Plan    PT Assessment Patient needs continued PT services  PT Problem List Decreased strength;Decreased range of motion;Decreased activity tolerance;Decreased balance;Decreased mobility;Decreased cognition;Decreased knowledge of use of DME;Decreased knowledge of precautions;Decreased safety awareness;Obesity;Pain       PT Treatment Interventions DME instruction;Gait training;Functional mobility training;Therapeutic activities;Therapeutic exercise;Balance training;Neuromuscular re-education;Cognitive remediation;Patient/family education;Wheelchair mobility training    PT Goals (Current goals can be found in the Care Plan section)  Acute Rehab PT Goals Patient Stated Goal: none stated, pt happy to be up OOB PT Goal Formulation: With patient Time For Goal Achievement: 10/25/18 Potential to Achieve Goals: Good    Frequency Min 2X/week   Barriers to discharge        Co-evaluation PT/OT/SLP Co-Evaluation/Treatment: Yes Reason for Co-Treatment: For patient/therapist safety;To address functional/ADL transfers PT goals addressed during session: Mobility/safety with mobility OT goals addressed during session: ADL's and self-care       AM-PAC PT "6 Clicks" Mobility  Outcome Measure  Help needed turning from your back to your side while in a flat bed without using bedrails?: A Lot Help needed moving from lying on your back to sitting on the side of a flat bed without using bedrails?: A Lot Help needed moving to and from a bed to a chair (including a wheelchair)?: A Lot Help needed standing up from a chair using your arms (e.g., wheelchair or bedside chair)?: A Lot Help needed to walk in hospital room?: Total Help needed climbing 3-5 steps with a railing? : Total 6 Click Score: 10    End of Session Equipment Utilized During Treatment: Gait belt Activity Tolerance: Patient tolerated treatment well Patient left: in chair;with call bell/phone within reach;with chair alarm set Nurse Communication: Mobility status PT Visit Diagnosis: Unsteadiness on feet (R26.81);Muscle weakness (generalized) (M62.81);Other abnormalities of gait and mobility (R26.89);Pain Pain - Right/Left: Right Pain - part of body: Leg    Time: [id number] PT Time Calculation (min) (ACUTE ONLY): 23 min   Charges:   PT Evaluation $PT Eval Moderate Complexity: 1 Mod          Van ClinesHolly Swati Granberry, [place]PT  Acute Rehabilitation Services Pager [phone] Office [phone]   Levi AlandHolly H Rameses Ou 10/11/2018, 11:22 AM

## 2018-10-12 ENCOUNTER — Inpatient Hospital Stay (HOSPITAL_COMMUNITY): Payer: Medicare Other

## 2018-10-12 DIAGNOSIS — N179 Acute kidney failure, unspecified: Secondary | ICD-10-CM

## 2018-10-12 DIAGNOSIS — R011 Cardiac murmur, unspecified: Secondary | ICD-10-CM

## 2018-10-12 LAB — MAGNESIUM: Magnesium: 1.7 mg/dL (ref 1.7–2.4)

## 2018-10-12 LAB — CBC
HCT: 26.2 % — ABNORMAL LOW (ref 36.0–46.0)
Hemoglobin: 8.9 g/dL — ABNORMAL LOW (ref 12.0–15.0)
MCH: 31.4 pg (ref 26.0–34.0)
MCHC: 34 g/dL (ref 30.0–36.0)
MCV: 92.6 fL (ref 80.0–100.0)
Platelets: 122 10*3/uL — ABNORMAL LOW (ref 150–400)
RBC: 2.83 MIL/uL — ABNORMAL LOW (ref 3.87–5.11)
RDW: 14.1 % (ref 11.5–15.5)
WBC: 8.6 10*3/uL (ref 4.0–10.5)
nRBC: 0 % (ref 0.0–0.2)

## 2018-10-12 LAB — COMPREHENSIVE METABOLIC PANEL
ALT: 24 U/L (ref 0–44)
AST: 32 U/L (ref 15–41)
Albumin: 2.5 g/dL — ABNORMAL LOW (ref 3.5–5.0)
Alkaline Phosphatase: 112 U/L (ref 38–126)
Anion gap: 10 (ref 5–15)
BUN: 19 mg/dL (ref 8–23)
CO2: 23 mmol/L (ref 22–32)
Calcium: 8.6 mg/dL — ABNORMAL LOW (ref 8.9–10.3)
Chloride: 97 mmol/L — ABNORMAL LOW (ref 98–111)
Creatinine, Ser: 1.28 mg/dL — ABNORMAL HIGH (ref 0.44–1.00)
GFR calc Af Amer: 44 mL/min — ABNORMAL LOW (ref >60)
GFR calc non Af Amer: 38 mL/min — ABNORMAL LOW (ref >60)
Glucose, Bld: 122 mg/dL — ABNORMAL HIGH (ref 70–99)
Potassium: 5.3 mmol/L — ABNORMAL HIGH (ref 3.5–5.1)
Sodium: 130 mmol/L — ABNORMAL LOW (ref 135–145)
Total Bilirubin: 1.9 mg/dL — ABNORMAL HIGH (ref 0.3–1.2)
Total Protein: 6.2 g/dL — ABNORMAL LOW (ref 6.5–8.1)

## 2018-10-12 LAB — GLUCOSE, CAPILLARY
Glucose-Capillary: 114 mg/dL — ABNORMAL HIGH (ref 70–99)
Glucose-Capillary: 147 mg/dL — ABNORMAL HIGH (ref 70–99)
Glucose-Capillary: 148 mg/dL — ABNORMAL HIGH (ref 70–99)
Glucose-Capillary: 161 mg/dL — ABNORMAL HIGH (ref 70–99)

## 2018-10-12 LAB — CK: Total CK: 47 U/L (ref 38–234)

## 2018-10-12 MED ORDER — SODIUM ZIRCONIUM CYCLOSILICATE 10 G PO PACK
10.0000 g | PACK | Freq: Once | ORAL | Status: AC
  Administered 2018-10-12: 10 g via ORAL
  Filled 2018-10-12: qty 1

## 2018-10-12 MED ORDER — SODIUM CHLORIDE 0.9 % IV SOLN
INTRAVENOUS | Status: DC | PRN
  Administered 2018-10-12: 1000 mL via INTRAVENOUS

## 2018-10-12 MED ORDER — IOHEXOL 300 MG/ML  SOLN
100.0000 mL | Freq: Once | INTRAMUSCULAR | Status: AC | PRN
  Administered 2018-10-12: 100 mL via INTRAVENOUS

## 2018-10-12 MED ORDER — SODIUM CHLORIDE 0.9 % IV BOLUS
500.0000 mL | Freq: Once | INTRAVENOUS | Status: AC
  Administered 2018-10-12: 500 mL via INTRAVENOUS

## 2018-10-12 NOTE — Progress Notes (Signed)
Subjective:   Brandy Anderson was examined and evaluated at bedside this AM. She was observed sleeping comfortably in bed but was difficult to arouse but opened eyes when requested. Informed by nursing staff that she refused lactulose overnight. She could not specify her reasoning for refusing the lactulose. She states she has been urinating without issues. She endorses pain in her RLE.   Objective:  Vital signs in last 24 hours: Vitals:   10/12/18 0045 10/12/18 0053 10/12/18 0442 10/12/18 0513  BP:   (!) 87/78 (!) 127/55  Pulse: (!) 54 91 74 72  Resp:   18   Temp:   97.9 F (36.6 C)   TempSrc:   Oral   SpO2: (!) 89% 95% 96%   Weight:      Height:       Constitution: NAD, resting in bed, answered questions Cardio: III/VI systolic murmur, RRR  Respiratory: non-labored breathing, on [place] MSK: trace LE edema, RLE +1 pitting edema with erythema extending upto the kneeand posteriolateral hematoma/discoleration, no crepitus, TTP Neuro: intermittent responses but followed commands Skin: otherwise c/d/i   Assessment/Plan:  Principal Problem:   Cellulitis of right lower extremity Active Problems:   Liver cirrhosis secondary to NASH (HCC)   AKI (acute kidney injury) (HCC)  Brandy Anderson is a 85yo female with PMH TIIDM, HTN,CKD (stage not yet known here), bipolar disorderseizures, acquired hypothyroidism, liver cirrhosis with varices, history of GI hemorrhage and lower extremity cellulitis presenting from [facility] with altered mental statusand RLE pain, swelling and redness.Daughter, Ron Parker, [phone]  RLE Cellulitis Appears similar to yesterday. Very TTP. Doppler and xr were done at [facility] over a week ago but with discoloration of leg will obtain CT   - CT w/wo contrast right leg - bolus - cont vanc - will complete antibiotic therapy inpatient as she failed bactrim and doxy outpatient  Hepatic Encephalopathy NASH Liver Cirrhosis  Refused all three  doses of lactulose yesterday. Discussed why this is important for her to take this medication. Appears more tired today. Will check on her in the afternoon.   - cont. Lactulose 10g tid, rifaximin 550 mg bid with goal of 3 bm per day  - cont. Holding lasix and spiro today - will likely need reduced dose - am CMP, CBC   Electrolyte Abnormalities Hyperkalemia, Hypomagnesemia, Hyponatremia K unchanged, continues to have slight hyponatremia. Hyperkalemia likely 2/2 her spiro although per [facility] she was not receiving this with bactrim and was also receiving extra doses of lasix. Cortisol taken in ED at admission wnl but will repeat for am with recurrent hypoglycemia and current labs.   - am cortisol  - lokelma today - am CMP, Mg - cont. repleting Mg  AKI on CKD Stage II-III Improving with holding diuretics, lasix 20 mg qd and spironolactone 100 mg qd.   Chronic Systolic Heart Failure  EF 50-55% 2018. Cont. Nadolol 20 mg qd.   Type II DM Normal medications include lantus 35U qhs and humalog 12U tid. Lantus held yesterday as she is having episodes of hypoglycemia due to inconsistent meals. Stated at admission that she does not usually eat lunch or dinner.   - hold lantus - consider d/c at discharge  - SSI sensitive  - qhs SSI   - glucerna shakes ordered   Borderline Personality Disorder Continue abilify 15 mg qd, cymbalta 60 mg qd.    VTE: lovenox  IVF: 500cc bolus for contract CT  Diet: carb modified  Code: DNR  Dispo: Anticipated discharge after completing antibiotic therapy.    Versie StarksSeawell, Elin Fenley A, DO 10/12/2018, 6:29 AM Pager: [phone]

## 2018-10-12 NOTE — Progress Notes (Signed)
  Date: 10/12/2018  Patient name: Brandy Anderson  Medical record number: [id number]  Date of birth: [birth year]   I have seen and evaluated this patient and I have discussed the plan of care with the house staff. Please see Dr. Al Decant note for complete details. I concur with her findings and plan.     Inez Catalina, MD 10/12/2018, 4:44 PM

## 2018-10-12 NOTE — Progress Notes (Signed)
Pt refusing cpap for the night. ?

## 2018-10-12 NOTE — Care Management Important Message (Signed)
Important Message  Patient Details  Name: Brandy Anderson MRN: [id number] Date of Birth: 1930-10-02   Medicare Important Message Given:  Yes    Curtez Brallier 10/12/2018, 3:11 PM

## 2018-10-13 DIAGNOSIS — D649 Anemia, unspecified: Secondary | ICD-10-CM

## 2018-10-13 DIAGNOSIS — N182 Chronic kidney disease, stage 2 (mild): Secondary | ICD-10-CM

## 2018-10-13 LAB — MAGNESIUM: Magnesium: 1.7 mg/dL (ref 1.7–2.4)

## 2018-10-13 LAB — COMPREHENSIVE METABOLIC PANEL
ALT: 22 U/L (ref 0–44)
AST: 29 U/L (ref 15–41)
Albumin: 2.5 g/dL — ABNORMAL LOW (ref 3.5–5.0)
Alkaline Phosphatase: 109 U/L (ref 38–126)
Anion gap: 7 (ref 5–15)
BUN: 18 mg/dL (ref 8–23)
CO2: 23 mmol/L (ref 22–32)
Calcium: 8.3 mg/dL — ABNORMAL LOW (ref 8.9–10.3)
Chloride: 100 mmol/L (ref 98–111)
Creatinine, Ser: 1.18 mg/dL — ABNORMAL HIGH (ref 0.44–1.00)
GFR calc Af Amer: 49 mL/min — ABNORMAL LOW (ref >60)
GFR calc non Af Amer: 42 mL/min — ABNORMAL LOW (ref >60)
Glucose, Bld: 150 mg/dL — ABNORMAL HIGH (ref 70–99)
Potassium: 4.8 mmol/L (ref 3.5–5.1)
Sodium: 130 mmol/L — ABNORMAL LOW (ref 135–145)
Total Bilirubin: 1.6 mg/dL — ABNORMAL HIGH (ref 0.3–1.2)
Total Protein: 6.1 g/dL — ABNORMAL LOW (ref 6.5–8.1)

## 2018-10-13 LAB — CBC
HCT: 25.5 % — ABNORMAL LOW (ref 36.0–46.0)
Hemoglobin: 8.5 g/dL — ABNORMAL LOW (ref 12.0–15.0)
MCH: 30.9 pg (ref 26.0–34.0)
MCHC: 33.3 g/dL (ref 30.0–36.0)
MCV: 92.7 fL (ref 80.0–100.0)
Platelets: 121 10*3/uL — ABNORMAL LOW (ref 150–400)
RBC: 2.75 MIL/uL — ABNORMAL LOW (ref 3.87–5.11)
RDW: 14.1 % (ref 11.5–15.5)
WBC: 8.2 10*3/uL (ref 4.0–10.5)
nRBC: 0 % (ref 0.0–0.2)

## 2018-10-13 LAB — GLUCOSE, CAPILLARY
Glucose-Capillary: 112 mg/dL — ABNORMAL HIGH (ref 70–99)
Glucose-Capillary: 151 mg/dL — ABNORMAL HIGH (ref 70–99)
Glucose-Capillary: 179 mg/dL — ABNORMAL HIGH (ref 70–99)
Glucose-Capillary: 189 mg/dL — ABNORMAL HIGH (ref 70–99)

## 2018-10-13 LAB — CORTISOL: Cortisol, Plasma: 8.1 ug/dL

## 2018-10-13 MED ORDER — FUROSEMIDE 20 MG PO TABS
20.0000 mg | ORAL_TABLET | Freq: Every day | ORAL | Status: DC
  Administered 2018-10-13 – 2018-10-14 (×2): 20 mg via ORAL
  Filled 2018-10-13 (×2): qty 1

## 2018-10-13 MED ORDER — SPIRONOLACTONE 25 MG PO TABS
50.0000 mg | ORAL_TABLET | Freq: Every day | ORAL | Status: DC
  Administered 2018-10-13 – 2018-10-16 (×4): 50 mg via ORAL
  Filled 2018-10-13 (×4): qty 2

## 2018-10-13 NOTE — Progress Notes (Signed)
Subjective:  Ms.Rabanal was examined and evaluated at bedside this AM. She was observed resting comfortably in bed. She states her leg pain is tolerable but she was itching. She has not yet had a bowel movement this morning. She mentions tolerating her diet without difficulty. Denies any abdominal pain or dyspnea. She states she took her lactulose yesterday.   Objective:  Vital signs in last 24 hours: Vitals:   10/12/18 2041 10/12/18 2043 10/13/18 0331 10/13/18 0500  BP:  (!) 102/51 (!) 131/56   Pulse:  76 80   Resp:  18 18   Temp:  98 F (36.7 C) 98 F (36.7 C)   TempSrc:  Oral Oral   SpO2:  100% 97%   Weight: 103.5 kg   103.5 kg  Height:       Constitution: NAD, supine in bed Cardio: RRR, no m/r/g  Respiratory: CTA, no w/r/r  Abdominal: distended, soft, NTTP, +BS MSK: +2 LE edema b/l, RLE erythema decreased compared to yesterday,andposteriolateral hematoma/discoleration, no crepitus Neuro: normal affect, following commands, answering questions appropriately.   Assessment/Plan:  Principal Problem:   Cellulitis of right lower extremity Active Problems:   Liver cirrhosis secondary to NASH (HCC)   AKI (acute kidney injury) (HCC)  Lucila Mainethel Hausmann is a 85yo female with PMH TIIDM, HTN,CKD (stage not yet known here), bipolar disorderseizures, acquired hypothyroidism, liver cirrhosis with varices, history of GI hemorrhage and lower extremity cellulitis [facility] with altered mental statusand RLE pain, swelling and redness.Daughter, Ron ParkerJenny Buckner, [phone]  RLE Cellulitis Continues to improve. CT without ulcer or osteomyelitis but 10cm phlegmon is present. This should absorb overtime with antibiotic therapy treating her celluiltis. Will continue to monitor and if no improvement with consult orthopedic surgery.   - cont vanc for at least 7 days total - will reassess at this time.   - [facility] see if vanc levels can  be obtained to complete there if further treatment is needed.  - am CBC   Hepatic Encephalopathy NASH Liver Cirrhosis  Thrombocytopenia  Took lactulose yesterday. Labs stable. Alert and oriented today.    - cont. Lactulose 10g tid, rifaximin 550 mg bid with goal of 3 bm per day  - restarted lasix 20 mg and will restart spiro at 40 mg qd - am CMP, CBC   Acute on Chronic Anemia Hemoglobin 8.5 from 10 at admission. Likely secondary to cirrhosis. She is not on iron supplementation or B12 supplementation.   - am ferritin, iron, TIBC - am B12, MMA  - am CBC   Electrolyte Abnormalities Hyperkalemia, Hypomagnesemia, Hyponatremia Normokalemic after receiving lokelma yesterday. Continues to have slight hyponatremia. Cortisol wnl.   - am CMP, Mg  AKI on CKD Stage II-III Improved with holding diuretics. Appears volume up and will restart lasix 20 mg qd and spironolactone although at reduced dose 40 mg qd.  Chronic Systolic Heart Failure  EF 50-55% 2018. Cont. Nadolol 20 mg qd.   - restart lasix 20 mg qd  - start spironolactone 40 mg qd  Type II DM Normal medications include lantus 35U qhs and humalog 12U tid. Lantus held yesterday as she is having episodes of hypoglycemia due to inconsistent meals. Stated at admission that she does not usually eat lunch or dinner.   - hold lantus - consider d/c at discharge  - SSI sensitive  - qhs SSI   - glucerna shakes ordered   Borderline Personality Disorder Continue abilify 15 mg qd, cymbalta 60  mg qd.   VTE: lovenox IVF: none  Diet: carb modified  Code: DNR  Dispo: Anticipated discharge pending completion of antibiotic therapy.   Versie Starks, DO 10/13/2018, 6:22 AM Pager: [phone]

## 2018-10-13 NOTE — Progress Notes (Signed)
Patient refused CPAP tonight 

## 2018-10-13 NOTE — Progress Notes (Signed)
Physical Therapy Treatment Patient Details Name: Brandy Anderson MRN: [id number] DOB: 04/04/1931 Today's Date: 10/13/2018    History of Present Illness 85 year old person living with NASH cirrhosis admitted with a severe nonpurulent cellulitis of the right lower extremity which is improving with IV vancomycin.    PT Comments    Patient with improved mobility today, now mod A to stand EOB. Patient unable to side step or tolerate gait, returned to bed, given some therex. Cont to rec SNF.    Follow Up Recommendations  SNF     Equipment Recommendations  (TBD next venue)    Recommendations for Other Services       Precautions / Restrictions Precautions Precautions: Fall Precaution Comments: Watch for urinary incontinence Restrictions Weight Bearing Restrictions: No    Mobility  Bed Mobility Overal bed mobility: Needs Assistance Bed Mobility: Supine to Sit     Supine to sit: Max assist;+2 for safety/equipment;+2 for physical assistance     General bed mobility comments: assist for LEs and trunk elevation, cues to assist with scooting hips towards EOB  Transfers Overall transfer level: Needs assistance Equipment used: Rolling walker (2 wheeled) Transfers: Sit to/from [employer];Stand Pivot Transfers Sit to Stand: Mod assist Stand pivot transfers: Mod assist;+2 physical assistance       General transfer comment: mod A of 1 today to stand, fatigues quickly preamture sit back onto be  Ambulation/Gait             General Gait Details: unable   Stairs             Wheelchair Mobility    Modified Rankin (Stroke Patients Only)       Balance Overall balance assessment: Needs assistance Sitting-balance support: Feet supported;Bilateral upper extremity supported Sitting balance-Leahy Scale: Poor   Postural control: Posterior lean Standing balance support: Bilateral upper extremity supported;During functional activity Standing balance-Leahy Scale:  Poor Standing balance comment: reliant on UE support and external assist                            Cognition Arousal/Alertness: Awake/alert Behavior During Therapy: WFL for tasks assessed/performed Overall Cognitive Status: No family/caregiver present to determine baseline cognitive functioning Area of Impairment: Awareness;Problem solving                           Awareness: Intellectual Problem Solving: Difficulty sequencing;Requires verbal cues;Requires tactile cues        Exercises General Exercises - Lower Extremity Long Arc Quad: 20 reps    General Comments        Pertinent Vitals/Pain Faces Pain Scale: Hurts little more Pain Location: RLE Pain Descriptors / Indicators: Aching    Home Living                      Prior Function            PT Goals (current goals can now be found in the care plan section) Acute Rehab PT Goals PT Goal Formulation: With patient Time For Goal Achievement: 10/25/18 Potential to Achieve Goals: Good Progress towards PT goals: Progressing toward goals    Frequency    Min 2X/week      PT Plan Current plan remains appropriate    Co-evaluation PT/OT/SLP Co-Evaluation/Treatment: Yes            AM-PAC PT "6 Clicks" Mobility   Outcome Measure  Help needed turning from your back  to your side while in a flat bed without using bedrails?: A Lot Help needed moving from lying on your back to sitting on the side of a flat bed without using bedrails?: A Lot Help needed moving to and from a bed to a chair (including a wheelchair)?: A Lot Help needed standing up from a chair using your arms (e.g., wheelchair or bedside chair)?: A Lot Help needed to walk in hospital room?: Total Help needed climbing 3-5 steps with a railing? : Total 6 Click Score: 10    End of Session Equipment Utilized During Treatment: Gait belt Activity Tolerance: Patient tolerated treatment well Patient left: in chair;with call  bell/phone within reach;with chair alarm set Nurse Communication: Mobility status PT Visit Diagnosis: Unsteadiness on feet (R26.81);Muscle weakness (generalized) (M62.81);Other abnormalities of gait and mobility (R26.89);Pain Pain - Right/Left: Right Pain - part of body: Leg     Time: 1700-1720 PT Time Calculation (min) (ACUTE ONLY): 20 min  Charges:  $Therapeutic Activity: 8-22 mins                     Etta Grandchild, PT, DPT Acute Rehabilitation Services Pager: [phone] Office: [phone]     Etta Grandchild 10/13/2018, 4:59 PM

## 2018-10-13 NOTE — Progress Notes (Signed)
  Date: 10/13/2018  Patient name: Brandy Anderson  Medical record number: [id number]  Date of birth: 09-15-1930   I have seen and evaluated this patient and I have discussed the plan of care with the house staff. Please see Dr. Al Decant note for complete details. I concur with her findings and plan.    Inez Catalina, MD 10/13/2018, 2:41 PM

## 2018-10-14 LAB — GLUCOSE, CAPILLARY
Glucose-Capillary: 140 mg/dL — ABNORMAL HIGH (ref 70–99)
Glucose-Capillary: 187 mg/dL — ABNORMAL HIGH (ref 70–99)
Glucose-Capillary: 234 mg/dL — ABNORMAL HIGH (ref 70–99)
Glucose-Capillary: 184 mg/dL — ABNORMAL HIGH (ref 70–99)

## 2018-10-14 LAB — COMPREHENSIVE METABOLIC PANEL
ALT: 23 U/L (ref 0–44)
AST: 29 U/L (ref 15–41)
Albumin: 2.6 g/dL — ABNORMAL LOW (ref 3.5–5.0)
Alkaline Phosphatase: 115 U/L (ref 38–126)
Anion gap: 8 (ref 5–15)
BUN: 17 mg/dL (ref 8–23)
CO2: 23 mmol/L (ref 22–32)
Calcium: 8.4 mg/dL — ABNORMAL LOW (ref 8.9–10.3)
Chloride: 100 mmol/L (ref 98–111)
Creatinine, Ser: 1.4 mg/dL — ABNORMAL HIGH (ref 0.44–1.00)
GFR calc Af Amer: 40 mL/min — ABNORMAL LOW (ref >60)
GFR calc non Af Amer: 34 mL/min — ABNORMAL LOW (ref >60)
Glucose, Bld: 145 mg/dL — ABNORMAL HIGH (ref 70–99)
Potassium: 4.5 mmol/L (ref 3.5–5.1)
Sodium: 131 mmol/L — ABNORMAL LOW (ref 135–145)
Total Bilirubin: 1.6 mg/dL — ABNORMAL HIGH (ref 0.3–1.2)
Total Protein: 6.3 g/dL — ABNORMAL LOW (ref 6.5–8.1)

## 2018-10-14 LAB — CBC
HCT: 26.3 % — ABNORMAL LOW (ref 36.0–46.0)
Hemoglobin: 8.8 g/dL — ABNORMAL LOW (ref 12.0–15.0)
MCH: 30.9 pg (ref 26.0–34.0)
MCHC: 33.5 g/dL (ref 30.0–36.0)
MCV: 92.3 fL (ref 80.0–100.0)
Platelets: 137 10*3/uL — ABNORMAL LOW (ref 150–400)
RBC: 2.85 MIL/uL — ABNORMAL LOW (ref 3.87–5.11)
RDW: 14.3 % (ref 11.5–15.5)
WBC: 9.6 10*3/uL (ref 4.0–10.5)
nRBC: 0 % (ref 0.0–0.2)

## 2018-10-14 LAB — CULTURE, BLOOD (ROUTINE X 2)
Culture: NO GROWTH
Culture: NO GROWTH
Special Requests: ADEQUATE
Special Requests: ADEQUATE

## 2018-10-14 LAB — IRON AND TIBC
Iron: 35 ug/dL (ref 28–170)
Saturation Ratios: 12 % (ref 10.4–31.8)
TIBC: 291 ug/dL (ref 250–450)
UIBC: 256 ug/dL

## 2018-10-14 LAB — MAGNESIUM: Magnesium: 1.6 mg/dL — ABNORMAL LOW (ref 1.7–2.4)

## 2018-10-14 LAB — FERRITIN: Ferritin: 66 ng/mL (ref 11–307)

## 2018-10-14 LAB — VITAMIN B12: Vitamin B-12: 1304 pg/mL — ABNORMAL HIGH (ref 180–914)

## 2018-10-14 MED ORDER — FUROSEMIDE 10 MG/ML IJ SOLN: 20.0000 mg | Freq: Once | INTRAMUSCULAR | Status: DC

## 2018-10-14 MED ORDER — FUROSEMIDE 20 MG PO TABS
20.0000 mg | ORAL_TABLET | Freq: Every day | ORAL | Status: DC
  Administered 2018-10-15 – 2018-10-16 (×2): 20 mg via ORAL
  Filled 2018-10-14 (×2): qty 1

## 2018-10-14 MED ORDER — FUROSEMIDE 20 MG PO TABS
20.0000 mg | ORAL_TABLET | Freq: Once | ORAL | Status: AC
  Administered 2018-10-14: 20 mg via ORAL
  Filled 2018-10-14: qty 1

## 2018-10-14 MED ORDER — MAGNESIUM SULFATE 2 GM/50ML IV SOLN
2.0000 g | Freq: Once | INTRAVENOUS | Status: AC
  Administered 2018-10-14: 2 g via INTRAVENOUS
  Filled 2018-10-14: qty 50

## 2018-10-14 NOTE — Progress Notes (Signed)
Physical Therapy Treatment Patient Details Name: Brandy Anderson MRN: [id number] DOB: March 07, 1931 Today's Date: 10/14/2018    History of Present Illness 85 year old person living with NASH cirrhosis admitted with a severe nonpurulent cellulitis of the right lower extremity which is improving with IV vancomycin.    PT Comments    Patient continues to require significant assist for mobility. She required max a to sit on the edge of the bed and mod a to stand. She was unable to take a step. She had an incontinence issue so she was put back into bed with max a+2 and mod a to roll. She would benefit from rehab at a SNF.    Follow Up Recommendations  SNF     Equipment Recommendations  Other (comment)    Recommendations for Other Services       Precautions / Restrictions Precautions Precautions: Fall Precaution Comments: Watch for urinary incontinence Restrictions Weight Bearing Restrictions: No    Mobility  Bed Mobility Overal bed mobility: Needs Assistance Bed Mobility: Supine to Sit;Sit to Supine     Supine to sit: Max assist;HOB elevated Sit to supine: Max assist;+2 for physical assistance   General bed mobility comments: Max a to scoot to the edge of the bed and amx a to sit. Once sitting min to mod a to remain sitting. She fell posteriorly several times. Nursing helped therapy to get the patient back into bed.   Transfers Overall transfer level: Needs assistance Equipment used: Rolling walker (2 wheeled) Transfers: Sit to/from [employer] CorporationStand;Stand Pivot Transfers Sit to Stand: Mod assist         General transfer comment: Sit to stand 3x with therapy. On each occasion the patient reuqired mod a. Therapy tried to get her to take a step but she couldnt move her left leg   Ambulation/Gait                 Stairs             Wheelchair Mobility    Modified Rankin (Stroke Patients Only)       Balance Overall balance assessment: Needs  assistance Sitting-balance support: Feet supported;Bilateral upper extremity supported Sitting balance-Leahy Scale: Poor Sitting balance - Comments: occasional posterior lean requires intermittent Min to mod a for balance  Postural control: Posterior lean Standing balance support: Bilateral upper extremity supported;During functional activity Standing balance-Leahy Scale: Poor Standing balance comment: reliant on UE support and external assist                            Cognition Arousal/Alertness: Awake/alert Behavior During Therapy: WFL for tasks assessed/performed Overall Cognitive Status: No family/caregiver present to determine baseline cognitive functioning Area of Impairment: Awareness;Problem solving                           Awareness: Intellectual Problem Solving: Difficulty sequencing;Requires verbal cues;Requires tactile cues        Exercises      General Comments General comments (skin integrity, edema, etc.): Pt pleased to sit at EOB to eat meal.      Pertinent Vitals/Pain Pain Assessment: Faces Faces Pain Scale: Hurts a little bit Pain Location: RLE Pain Descriptors / Indicators: Aching Pain Intervention(s): Monitored during session    Home Living                      Prior Function  PT Goals (current goals can now be found in the care plan section) Acute Rehab PT Goals Patient Stated Goal: none stated, pt happy to be up OOB PT Goal Formulation: With patient Time For Goal Achievement: 10/25/18 Potential to Achieve Goals: Good    Frequency    Min 2X/week      PT Plan Current plan remains appropriate    Co-evaluation              AM-PAC PT "6 Clicks" Mobility   Outcome Measure  Help needed turning from your back to your side while in a flat bed without using bedrails?: A Lot Help needed moving from lying on your back to sitting on the side of a flat bed without using bedrails?: A Lot Help  needed moving to and from a bed to a chair (including a wheelchair)?: A Lot Help needed standing up from a chair using your arms (e.g., wheelchair or bedside chair)?: A Lot Help needed to walk in hospital room?: Total Help needed climbing 3-5 steps with a railing? : Total 6 Click Score: 10    End of Session Equipment Utilized During Treatment: Gait belt Activity Tolerance: Patient tolerated treatment well Patient left: in chair;with call bell/phone within reach;with chair alarm set Nurse Communication: Mobility status PT Visit Diagnosis: Unsteadiness on feet (R26.81);Muscle weakness (generalized) (M62.81);Other abnormalities of gait and mobility (R26.89);Pain Pain - Right/Left: Right Pain - part of body: Leg     Time: 0305-0325 PT Time Calculation (min) (ACUTE ONLY): 20 min  Charges:  $Therapeutic Activity: 8-22 mins                      Dessie Coma PT DPT  10/14/2018, 4:40 PM

## 2018-10-14 NOTE — Progress Notes (Signed)
   Subjective:  Brandy Anderson was examined and evaluated at bedside this AM. States her leg pain has improved. Requesting nurse assitance to go to the bathroom. Denies any abdominal pain or shortness of breath.   Objective:  Vital signs in last 24 hours: Vitals:   10/13/18 2038 10/13/18 2038 10/14/18 0443 10/14/18 0550  BP: (!) 129/51 (!) 129/51  130/64  Pulse: 86 86  80  Resp: 18 18  18   Temp: 98 F (36.7 C) 98 F (36.7 C)  98.3 F (36.8 C)  TempSrc:    Oral  SpO2: 97% 97%  97%  Weight:  103.5 kg 103.5 kg   Height:       Constitution: nad, pleasant Cardio: RRR, III/VI systolic murmur Abdominal: distended, soft, NTTP, umbilical hernia MSK: +2 pitting edema RLE with decreased erythema and warmth RLE, +1 pitting edema LLE Neuro: a&o, normal affect  Assessment/Plan:  Principal Problem:   Cellulitis of right lower extremity Active Problems:   Liver cirrhosis secondary to NASH (HCC)   AKI (acute kidney injury) (HCC)  Brandy Anderson is a 85yo female with PMH TIIDM, HTN,CKD (stage not yet known here), bipolar disorderseizures, acquired hypothyroidism, liver cirrhosis with varices, history of GI hemorrhage and lower extremity cellulitis presenting from [facility] with altered mental statusand RLE pain, swelling and redness.Daughter, Ron Parker, [phone]. Stays at [facility]: Nurse Myrene Buddy knows her well.   RLE Cellulitis Continuing to improve. With failure of bactrim and doxycycline would like to complete treatment with IV Vanc.   - continue vanc 10 days total - will touch base with [facility] to figure out if this can be completed there.  - am CBC   NASH Liver Cirrhosis Thrombocytopenia  Stable. She has been adherent with her lactulose   - cont. Lactulose 10g tid, rifaximin 550 mg bid with goal of 3 bm per day  - cont. Lasix and spironolactone 50 mg qd - am CMP  Electrolyte Abnormalities  Hypomagnesemia - repleting   Acute on Chronic  Anemia Hemoglobin stable. Iron decreased, TSAT <20. B12 normal. Likely secondary to cirrhosis as well.   - cont. Iron supplement  Chronic Systolic HF (EF 2018 50-55%) Appears more volume up yesterday and today after holding diuretics for AKI.   - lasix 40 mg po today - continue spiro 40 mg qd - Cont. Nadolol 20 mg qd.   Type II DM Normal medications include lantus 35U qhs and humalog 12U tid. Lantus due to hypoglycemia with inconsistent po intake. Stated at admission that she does not usually eat lunch or dinner.   - SSI sensitive  - qhs SSI  - glucerna shakes ordered  Borderline Personality Disorder  Continue abilify 15 mg qd and cymbalta 60 mg qd.   VTE: lovenox  IVF: none  Diet: carb modified  Code: DNR  Dispo: Anticipated discharge pending completion of antibiotic therapy.   Versie Starks, DO 10/14/2018, 6:42 AM Pager: [phone]

## 2018-10-14 NOTE — Progress Notes (Signed)
  Date: 10/14/2018  Patient name: Brandy Anderson  Medical record number: [id number]  Date of birth: 04/21/1931   I have seen and evaluated this patient and I have discussed the plan of care with the house staff. Please see Dr. Al Decant note for complete details. I concur with her findings and plan.  Erythema is improved in the RLE today, however, swelling is worsened throughout.  Her diuretics were on hold for AKI and Hyperkalemia.  These have resolved.  Increase lasix today to 40mg .  Monitor UOP.  Continue vancomycin for a 10 day course.   Inez Catalina, MD 10/14/2018, 1:07 PM

## 2018-10-14 NOTE — Progress Notes (Signed)
Occupational Therapy Treatment Patient Details Name: Brandy Anderson MRN: [id number] DOB: [birth year] Today's Date: 10/14/2018    History of present illness 85 year old person living with NASH cirrhosis admitted with a severe nonpurulent cellulitis of the right lower extremity which is improving with IV vancomycin.   OT comments  Pt progressing toward OT goals. Pt tolerated session well with enjoyment to sit up for ADL task. Pt required assistance with LE and trunk to transition semi reclined to EOB. Hand over hand assistance for hand placement on bed rail. Pt demonstrated improved sitting tolerance with intermittent Min guard for core stability. Pt observed to have difficulty with fine motor grip and manipulation of utensils for feeding. Pt will benefit from continued OT to address sitting tolerance, strength and ADLs. DC and freq remains the same. OT will continue to follow acutely.   Follow Up Recommendations  SNF;Supervision/Assistance - 24 hour(recommend pt return to her current SNF facility)    Equipment Recommendations  Other (comment)(TBA)    Recommendations for Other Services      Precautions / Restrictions Precautions Precautions: Fall Precaution Comments: Watch for urinary incontinence Restrictions Weight Bearing Restrictions: No       Mobility Bed Mobility Overal bed mobility: Needs Assistance Bed Mobility: Supine to Sit     Supine to sit: Max assist;HOB elevated     General bed mobility comments: Pt instructed for hand placement on hand rail to assist with transition to sit at EOB, assist with LE and trunk elevation with scoot pad.   Transfers Overall transfer level: Needs assistance                    Balance Overall balance assessment: Needs assistance Sitting-balance support: Feet supported;Bilateral upper extremity supported Sitting balance-Leahy Scale: Poor Sitting balance - Comments: occasional posterior lean requires intermittent Min guard to  maintain balance Postural control: Posterior lean Standing balance support: Bilateral upper extremity supported;During functional activity Standing balance-Leahy Scale: Poor Standing balance comment: reliant on UE support and external assist                           ADL either performed or assessed with clinical judgement   ADL Overall ADL's : Needs assistance/impaired Eating/Feeding: Set up;Sitting Eating/Feeding Details (indicate cue type and reason): Pt independent in feeding, however, some deficits in fine motor skills and dexterity to manipulate utensils. Required Min Guard in sitting stabiliy for safety.                                  Functional mobility during ADLs: (stand pivot transfers) General ADL Comments: With decreased sitting balance, intervention focused on sitting tolerance during ADL task (eating). Pt demonstrated some posterior leaning but with Min guard assist and hand placement pt able to activite core strength.     Vision       Perception     Praxis      Cognition Arousal/Alertness: Awake/alert Behavior During Therapy: WFL for tasks assessed/performed Overall Cognitive Status: No family/caregiver present to determine baseline cognitive functioning Area of Impairment: Awareness;Problem solving                           Awareness: Intellectual Problem Solving: Difficulty sequencing;Requires verbal cues;Requires tactile cues          Exercises Exercises: General Lower Extremity   Shoulder Instructions  General Comments Pt pleased to sit at EOB to eat meal.    Pertinent Vitals/ Pain       Pain Assessment: Faces Faces Pain Scale: Hurts a little bit Pain Location: RLE Pain Descriptors / Indicators: Aching Pain Intervention(s): Monitored during session  Home Living                                          Prior Functioning/Environment              Frequency  Min 2X/week         Progress Toward Goals  OT Goals(current goals can now be found in the care plan section)  Progress towards OT goals: Progressing toward goals  Acute Rehab OT Goals Patient Stated Goal: none stated, pt happy to be up OOB OT Goal Formulation: With patient Time For Goal Achievement: 10/25/18 Potential to Achieve Goals: Good ADL Goals Pt Will Perform Grooming: with min guard assist;sitting Pt Will Perform Upper Body Bathing: with min guard assist;sitting Pt Will Perform Upper Body Dressing: with min guard assist;sitting Pt Will Perform Lower Body Dressing: with mod assist;with caregiver independent in assisting;sit to/from stand Pt Will Transfer to Toilet: with min assist;stand pivot transfer Pt Will Perform Toileting - Clothing Manipulation and hygiene: with min assist;sit to/from stand  Plan Discharge plan remains appropriate;Frequency remains appropriate    Co-evaluation    PT/OT/SLP Co-Evaluation/Treatment: Yes            AM-PAC OT "6 Clicks" Daily Activity     Outcome Measure   Help from another person eating meals?: None Help from another person taking care of personal grooming?: A Little Help from another person toileting, which includes using toliet, bedpan, or urinal?: Total Help from another person bathing (including washing, rinsing, drying)?: A Lot Help from another person to put on and taking off regular upper body clothing?: A Lot Help from another person to put on and taking off regular lower body clothing?: Total 6 Click Score: 13    End of Session    OT Visit Diagnosis: Muscle weakness (generalized) (M62.81);Unsteadiness on feet (R26.81)   Activity Tolerance Patient tolerated treatment well   Patient Left with call bell/phone within reach;in bed;with bed alarm set   Nurse Communication Mobility status;Need for lift equipment(to use Stedy)        Time: 5947-0761 OT Time Calculation (min): 21 min  Charges: OT General Charges $OT Visit:  1 Visit OT Treatments $Self Care/Home Management : 8-22 mins  Marquette Old, MSOT, OTR/L  Supplemental Rehabilitation Services  [phone]   Zigmund Daniel 10/14/2018, 2:24 PM

## 2018-10-14 NOTE — Progress Notes (Signed)
Pharmacy Antibiotic Note  Brandy Anderson is a 85 y.o. female admitted on 10/09/2018 with multiple comorbidities. Started antibiotics for cellulitis. The patient is noted to have an allergy listed to Vancomycin however has been tolerating. Pharmacy consulted to dose.   SCr 1.4, CrCl~35 ml/min.   Vancomycin 750 mg IV Q 24 hrs. Goal AUC 400-550. Expected AUC: 448.9 SCr used: 1.45   Plan: - Continue Vancomycin to 750 mg IV every 24 hours - Length of therapy set at 7 days per MD note with stop date of 5/22. Please f/u if longer course needed.  - Will continue to follow renal function, culture results, and antibiotic de-escalation plans    Height: 5\' 5"  (165.1 cm) Weight: 228 lb 2.8 oz (103.5 kg) IBW/kg (Calculated) : 57  Temp (24hrs), Avg:98.2 F (36.8 C), Min:98 F (36.7 C), Max:98.7 F (37.1 C)  Recent Labs  Lab 10/09/18 0409 10/09/18 0641  10/10/18 0008 10/10/18 0625 10/11/18 0318 10/12/18 0407 10/13/18 0337 10/14/18 0716  WBC 10.4  --   --  11.4*  --  10.5 8.6 8.2 9.6  CREATININE 1.62*  --    < > 1.34* 1.36* 1.45* 1.28* 1.18* 1.40*  LATICACIDVEN 2.2* 2.4*  --   --   --   --   --   --   --    < > = values in this interval not displayed.      Antimicrobials this admission: 5/16 vancomycin > 5/16 cefepime x1  Dose adjustments this admission: N/A  Microbiology results: 5/16 Bcx >> ngtd Covid negative  Brandy Anderson, PharmD, BCPS Clinical Pharmacist [place] Please utilize Amion for appropriate phone number to reach the unit pharmacist [facility])

## 2018-10-15 LAB — COMPREHENSIVE METABOLIC PANEL
ALT: 23 U/L (ref 0–44)
AST: 36 U/L (ref 15–41)
Albumin: 2.6 g/dL — ABNORMAL LOW (ref 3.5–5.0)
Alkaline Phosphatase: 107 U/L (ref 38–126)
Anion gap: 9 (ref 5–15)
BUN: 17 mg/dL (ref 8–23)
CO2: 25 mmol/L (ref 22–32)
Calcium: 8.5 mg/dL — ABNORMAL LOW (ref 8.9–10.3)
Chloride: 97 mmol/L — ABNORMAL LOW (ref 98–111)
Creatinine, Ser: 1.29 mg/dL — ABNORMAL HIGH (ref 0.44–1.00)
GFR calc Af Amer: 44 mL/min — ABNORMAL LOW (ref >60)
GFR calc non Af Amer: 38 mL/min — ABNORMAL LOW (ref >60)
Glucose, Bld: 191 mg/dL — ABNORMAL HIGH (ref 70–99)
Potassium: 4.8 mmol/L (ref 3.5–5.1)
Sodium: 131 mmol/L — ABNORMAL LOW (ref 135–145)
Total Bilirubin: 1.7 mg/dL — ABNORMAL HIGH (ref 0.3–1.2)
Total Protein: 6.1 g/dL — ABNORMAL LOW (ref 6.5–8.1)

## 2018-10-15 LAB — CBC
HCT: 26.9 % — ABNORMAL LOW (ref 36.0–46.0)
Hemoglobin: 9.3 g/dL — ABNORMAL LOW (ref 12.0–15.0)
MCH: 31.3 pg (ref 26.0–34.0)
MCHC: 34.6 g/dL (ref 30.0–36.0)
MCV: 90.6 fL (ref 80.0–100.0)
Platelets: 133 10*3/uL — ABNORMAL LOW (ref 150–400)
RBC: 2.97 MIL/uL — ABNORMAL LOW (ref 3.87–5.11)
RDW: 14.3 % (ref 11.5–15.5)
WBC: 8.6 10*3/uL (ref 4.0–10.5)
nRBC: 0 % (ref 0.0–0.2)

## 2018-10-15 LAB — GLUCOSE, CAPILLARY
Glucose-Capillary: 152 mg/dL — ABNORMAL HIGH (ref 70–99)
Glucose-Capillary: 149 mg/dL — ABNORMAL HIGH (ref 70–99)
Glucose-Capillary: 181 mg/dL — ABNORMAL HIGH (ref 70–99)
Glucose-Capillary: 191 mg/dL — ABNORMAL HIGH (ref 70–99)

## 2018-10-15 LAB — MAGNESIUM: Magnesium: 1.7 mg/dL (ref 1.7–2.4)

## 2018-10-15 MED ORDER — VANCOMYCIN HCL IN DEXTROSE 750-5 MG/150ML-% IV SOLN: 750.0000 mg | INTRAVENOUS | Status: DC

## 2018-10-15 MED ORDER — SPIRONOLACTONE 50 MG PO TABS: 50.0000 mg | ORAL_TABLET | Freq: Every day | ORAL | Status: AC
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
## 2018-10-15 NOTE — Care Management Important Message (Signed)
Important Message  Patient Details  Name: Brandy Anderson MRN: [id number] Date of Birth: May 28, 1930   Medicare Important Message Given:  Yes    Jovon Streetman 10/15/2018, 3:07 PM

## 2018-10-15 NOTE — Progress Notes (Signed)
   Subjective:  She was examined and evaluated at bedside this AM. She was observed much more alert this morning. She states she has some abdominal pain due to 'eating too much.' Plan for discharge was explained to her. She denies SOB and states LE pain has improved a lot.   Objective:  Vital signs in last 24 hours: Vitals:   10/14/18 1733 10/14/18 2040 10/15/18 0433 10/15/18 0500  BP: (!) 149/82 137/67 131/66   Pulse: 85 73 74   Resp: (!) 22 18 18    Temp: 98 F (36.7 C) 97.7 F (36.5 C) 98.1 F (36.7 C)   TempSrc: Oral  Oral   SpO2: 100% 100% 99%   Weight:  103.6 kg  103.6 kg  Height:       Constitution: NAD, pleasant, sitting up in bed Cardio: RRR, III/VI systolic murmur RUSB Abdominal: distended, NTTP, soft, +BS MSK: +1 LE edema bilaterally  Neuro: normal affect, mild confusion, alert and oriented  Skin: RLE erythema extending to upper calf   Assessment/Plan:  Principal Problem:   Cellulitis of right lower extremity Active Problems:   Liver cirrhosis secondary to NASH (HCC)   AKI (acute kidney injury) (HCC)  Brandy Anderson is a 85yo female with PMH TIIDM, HTN,CKD (stage not yet known here), bipolar disorderseizures, acquired hypothyroidism, liver cirrhosis with varices, history of GI hemorrhage and lower extremity cellulitis presenting [facility] with altered mental statusand RLE pain, swelling and redness.Daughter, Ron Parker, [phone]. Stays at [facility]: Nurse Myrene Buddy knows her well.   RLE Cellulitis  Her cellulitis is improving slowly, I expect the phlegmon to take some time to reabsorb. Will complete IV Vanc at [facility] for ten days total.   - care management consulted   NASH Liver Cellulitis  Thrombocytopenia Stable.   - cont. Lactulose and rifaximin - cont. Lasix 20 mg qd and spiro 50 mg qd   Chronic Systolic HF   - continue diuretics - cont. Nadolol 20 mg qd   Type II DM Normal medications include lantus 35U  qhs and humalog 12U tid. Lantus has been held due to hypoglycemia with inconsistent po intake. Stated at admission that she does not usually eat lunch or dinner.   - SSI sensitive  - qhs SSI   Borderline Personality Disorder Continue abilify 15 mg qd and cymbalta 60 mg qd  VTE: lovenox IVF: none Diet: carb modified low sodium Code: DNR  Dispo: Anticipated discharge today.   Versie Starks, DO 10/15/2018, 6:16 AM Pager: [phone]

## 2018-10-15 NOTE — Progress Notes (Signed)
  Date: 10/15/2018  Patient name: Brandy Anderson  Medical record number: [id number]  Date of birth: May 26, 1931   I have seen and evaluated this patient and I have discussed the plan of care with the house staff. Please see Dr. Al Decant note for complete details. I concur with her findings and plan.  Based on review of CSW notes, patient is stable for discharge, however, SNF is not able to accept due to a paperwork issue.  Appears to be an issue on the SNF side based on chart review.  Patient will remain in house until at least 5/25.  Will discuss further with SW to see if we can get her discharged over the weekend.    Inez Catalina, MD 10/15/2018, 7:23 PM

## 2018-10-15 NOTE — Progress Notes (Signed)
Occupational Therapy Treatment Patient Details Name: Ludene Horgen MRN: [id number] DOB: 11/23/1930 Today's Date: 10/15/2018    History of present illness 85 year old person living with NASH cirrhosis admitted with a severe nonpurulent cellulitis of the right lower extremity which is improving with IV vancomycin.   OT comments  Pt. Seen for skilled OT tx. Session. Completed bed mobility max a.  Grooming task seated eob. Max Cues with intermittent physical assistance to maintain sitting balance, notable difficulty for patient secondary to significant posterior lean.    Follow Up Recommendations  SNF;Supervision/Assistance - 24 hour    Equipment Recommendations  Other (comment)    Recommendations for Other Services      Precautions / Restrictions Precautions Precautions: Fall Precaution Comments: Watch for urinary incontinence       Mobility Bed Mobility Overal bed mobility: Needs Assistance Bed Mobility: Supine to Sit;Sit to Supine     Supine to sit: Max assist;HOB elevated Sit to supine: Max assist   General bed mobility comments: utilized bed functions to aide in back to bed, pt. also able to bend b les at the knee and hold onto bed rail with RUE and assist with scooting towards hob  Transfers                      Balance                                           ADL either performed or assessed with clinical judgement   ADL Overall ADL's : Needs assistance/impaired     Grooming: Min guard;Set up;Sitting;Oral care Grooming Details (indicate cue type and reason): un supported sitting eob                               General ADL Comments: decreased sitting balance, cues to pull trunk upright and try to engage b feet on the floor, intermittent use of bed rail to regain balance      Vision       Perception     Praxis      Cognition Arousal/Alertness: Awake/alert Behavior During Therapy: WFL for tasks  assessed/performed Overall Cognitive Status: Within Functional Limits for tasks assessed                                          Exercises     Shoulder Instructions       General Comments      Pertinent Vitals/ Pain       Faces Pain Scale: Hurts a little bit Pain Location: stomach ache Pain Descriptors / Indicators: Aching Pain Intervention(s): Monitored during session;Limited activity within patient's tolerance;Repositioned  Home Living                                          Prior Functioning/Environment              Frequency  Min 2X/week        Progress Toward Goals  OT Goals(current goals can now be found in the care plan section)  Progress towards OT goals: Progressing toward goals     Plan  Other (comment)(reviewed with CNA pt. had performed oral care)    Co-evaluation                 AM-PAC OT "6 Clicks" Daily Activity     Outcome Measure   Help from another person eating meals?: None Help from another person taking care of personal grooming?: A Little Help from another person toileting, which includes using toliet, bedpan, or urinal?: Total Help from another person bathing (including washing, rinsing, drying)?: A Lot Help from another person to put on and taking off regular upper body clothing?: A Lot Help from another person to put on and taking off regular lower body clothing?: Total 6 Click Score: 13    End of Session    OT Visit Diagnosis: Muscle weakness (generalized) (M62.81);Unsteadiness on feet (R26.81)   Activity Tolerance Patient tolerated treatment well   Patient Left in bed;with call bell/phone within reach;with bed alarm set   Nurse Communication          Time: [id number] OT Time Calculation (min): 11 min  Charges: OT General Charges $OT Visit: 1 Visit OT Treatments $Self Care/Home Management : 8-22 mins   Robet LeuMorris, Siya Flurry Lorraine, COTA/L 10/15/2018, 8:37 AM

## 2018-10-15 NOTE — Progress Notes (Signed)
Pt refusing CPAP for tonight. Advised pt to notify for RT if she changes her mind.  

## 2018-10-15 NOTE — Clinical Social Work Note (Addendum)
Patient medically stable for discharge, however discharge cancelled as PASRR not received today. Brandy Anderson is an ALF resident at [facility] and is in need of ST rehab and will discharge to [facility]. CSW submitted for PASRR earlier today and made contact with [facility] several times this afternoon. Was told by Marylene Land with [facility] earlier today that PASRR would be received today however CSW informed 4:35 pm by another PASRR representative that PASRR status is Level 2 (CSW aware and requested clinicals faxed) and due to depression, a representative may come out and PASRR number won't be received until after 5/25.   Admissions director-Grace Ann and CSW were in contact with each other throughout the day, and last contact with Meryle Ready was at 4:48 pm to inform her that PASRR would not be received after 5/25. Per Meryle Ready, they will be able to accept patient once PASRR number received. Daughter contacted and updated. CSW will continue to follow and facilitate discharge to [facility] once PASRR number received.  Genelle Bal, MSW, LCSW Licensed Clinical Social Worker Clinical Social Work Department [employer] [phone]

## 2018-10-15 NOTE — NC FL2 (Signed)
[place] MEDICAID FL2 LEVEL OF CARE SCREENING TOOL     IDENTIFICATION  Patient Name: Brandy Anderson Birthdate: 1931-01-24 Sex: female Admission Date (Current Location): 10/09/2018  [facility] and [place] Number:  Producer, television/film/video and Address:  [facility], [address], [place], [place] [address]      Provider Number: [id number]  Attending Physician Name and Address:  Inez Catalina, MD  Relative Name and Phone Number:  Ron Parker - daughter, [phone]    Current Level of Care: Hospital Recommended Level of Care: Skilled Nursing Facility Prior Approval Number:    Date Approved/Denied:   PASRR Number: (Submitted for SFN PASRR - Pt from ALF. MUST QZ#[id number].) 10/15/18.  Discharge Plan: SNF    Current Diagnoses: Patient Active Problem List   Diagnosis Date Noted  . AKI (acute kidney injury) (HCC)   . Cellulitis of right lower extremity 10/09/2018  . Hypothyroidism 10/09/2018  . Liver cirrhosis secondary to NASH (HCC) 10/09/2018  . Seizure (HCC) 10/09/2018  . DIABETES MELLITUS, TYPE I 05/22/2008  . HYPERTENSION 05/22/2008  . GERD 05/22/2008    Orientation RESPIRATION BLADDER Height & Weight     Self  Normal Incontinent, External catheter(Catheter placed 5/16) Weight: 228 lb 6.3 oz (103.6 kg) Height:  5\' 5"  (165.1 cm)  BEHAVIORAL SYMPTOMS/MOOD NEUROLOGICAL BOWEL NUTRITION STATUS    Convulsions/Seizures(Hx seizures) Incontinent Diet(2 gram sodium)  AMBULATORY STATUS COMMUNICATION OF NEEDS Skin   Total Care(Was unable to ambulate with PT during hospitalization) Verbally Other (Comment), Skin abrasions(Severe cellulitis to RLE, full thickness wound to left pretibil area on left LE-dressing changes twice daily; Abrasion left leg; Ecchymosis right/left arm; MASD mid sacrum)                       Personal Care Assistance Level of Assistance  Bathing, Feeding, Dressing Bathing Assistance: Maximum assistance(Upper body min assist and  Lower body max assist) Feeding assistance: Limited assistance(Assistance with set-up) Dressing Assistance: Maximum assistance(Upper body min assist and Lower body max assist)     Functional Limitations Info  Sight, Hearing, Speech Sight Info: Impaired(Wears glasses sometimes per patient) Hearing Info: Adequate Speech Info: Adequate    SPECIAL CARE FACTORS FREQUENCY  PT (By licensed PT), OT (By licensed OT)     PT Frequency: 5/18 evaluation at hospital. PT at [facility] Eval and Treat; minimum of 5 days per week  OT Frequency: 5/18 evaluation at hospital. OT at SNF Eval and Treat; minimum of 5 days per week            Contractures Contractures Info: Not present    Additional Factors Info  Code Status, Allergies, Insulin Sliding Scale Code Status Info: DNR Allergies Info: Vancomycin, Codeine, Pioglitazone, Ezetimibe, Pravachol  Pravastatin, Zoloft, Sertraline, Amoxicillin-pot Clavulanate      Insulin Sliding Scale Info: 0-5 Units daily at bedtime; 0-9 Units 3 times per day with meals       Current Medications (10/15/2018):  This is the current hospital active medication list Current Facility-Administered Medications  Medication Dose Route Frequency Provider Last Rate Last Dose  . 0.9 %  sodium chloride infusion   Intravenous PRN Inez Catalina, MD   Stopped at 10/12/18 [phone]  . acetaminophen (TYLENOL) tablet 650 mg  650 mg Oral Q6H PRN Santos-Sanchez, Chelsea Primus, MD       Or  . acetaminophen (TYLENOL) suppository 650 mg  650 mg Rectal Q6H PRN Burna Cash, MD      . ARIPiprazole (ABILIFY)  tablet 15 mg  15 mg Oral Daily Burna CashSantos-Sanchez, Idalys, MD   15 mg at 10/15/18 0908  . artificial tears (LACRILUBE) ophthalmic ointment   Both Eyes Q4H PRN Santos-Sanchez, Idalys, MD      . DULoxetine (CYMBALTA) DR capsule 60 mg  60 mg Oral Daily Burna CashSantos-Sanchez, Idalys, MD   60 mg at 10/15/18 0908  . enoxaparin (LOVENOX) injection 40 mg  40 mg Subcutaneous Daily Seawell, Jaimie A, DO   40 mg at  10/15/18 0908  . feeding supplement (GLUCERNA SHAKE) (GLUCERNA SHAKE) liquid 237 mL  237 mL Oral TID BM Santos-Sanchez, Idalys, MD   237 mL at 10/12/18 2011  . ferrous sulfate tablet 325 mg  325 mg Oral Q breakfast Santos-Sanchez, Idalys, MD   325 mg at 10/15/18 0900  . furosemide (LASIX) tablet 20 mg  20 mg Oral Daily Seawell, Jaimie A, DO   20 mg at 10/15/18 1057  . insulin aspart (novoLOG) injection 0-5 Units  0-5 Units Subcutaneous QHS Burna CashSantos-Sanchez, Idalys, MD   1 Units at 10/10/18 2203  . insulin aspart (novoLOG) injection 0-9 Units  0-9 Units Subcutaneous TID WC Burna CashSantos-Sanchez, Idalys, MD   2 Units at 10/15/18 0900  . lactulose (CHRONULAC) 10 GM/15ML solution 10 g  10 g Oral TID Burna CashSantos-Sanchez, Idalys, MD   10 g at 10/15/18 0908  . levETIRAcetam (KEPPRA) tablet 500 mg  500 mg Oral BID Seawell, Jaimie A, DO   500 mg at 10/15/18 0908  . levothyroxine (SYNTHROID) tablet 88 mcg  88 mcg Oral Q0600 Burna CashSantos-Sanchez, Idalys, MD   88 mcg at 10/15/18 0545  . nadolol (CORGARD) tablet 20 mg  20 mg Oral Daily Santos-Sanchez, Idalys, MD   20 mg at 10/15/18 1057  . rifaximin (XIFAXAN) tablet 550 mg  550 mg Oral BID Burna CashSantos-Sanchez, Idalys, MD   550 mg at 10/15/18 1056  . sodium chloride flush (NS) 0.9 % injection 3 mL  3 mL Intravenous Q12H Santos-Sanchez, Idalys, MD   3 mL at 10/14/18 1023  . spironolactone (ALDACTONE) tablet 50 mg  50 mg Oral Daily Seawell, Jaimie A, DO   50 mg at 10/15/18 1057  . vancomycin (VANCOCIN) IVPB 750 mg/150 ml premix  750 mg Intravenous Q24H Ann HeldMartin, Elizabeth J, Colorado[place] 150 mL/hr at 10/15/18 0543 750 mg at 10/15/18 0543     Discharge Medications: Please see discharge summary for a list of discharge medications.  Relevant Imaging Results:  Relevant Lab Results:   Additional Information ss#  Cristobal GoldmannCrawford, Zoriana Oats Bradley, LCSW

## 2018-10-16 DIAGNOSIS — E119 Type 2 diabetes mellitus without complications: Secondary | ICD-10-CM

## 2018-10-16 DIAGNOSIS — L02415 Cutaneous abscess of right lower limb: Secondary | ICD-10-CM

## 2018-10-16 LAB — GLUCOSE, CAPILLARY
Glucose-Capillary: 172 mg/dL — ABNORMAL HIGH (ref 70–99)
Glucose-Capillary: 253 mg/dL — ABNORMAL HIGH (ref 70–99)
Glucose-Capillary: 154 mg/dL — ABNORMAL HIGH (ref 70–99)
Glucose-Capillary: 187 mg/dL — ABNORMAL HIGH (ref 70–99)

## 2018-10-16 LAB — BASIC METABOLIC PANEL
Anion gap: 8 (ref 5–15)
BUN: 14 mg/dL (ref 8–23)
CO2: 25 mmol/L (ref 22–32)
Calcium: 8.4 mg/dL — ABNORMAL LOW (ref 8.9–10.3)
Chloride: 98 mmol/L (ref 98–111)
Creatinine, Ser: 1.08 mg/dL — ABNORMAL HIGH (ref 0.44–1.00)
GFR calc Af Amer: 54 mL/min — ABNORMAL LOW (ref >60)
GFR calc non Af Amer: 47 mL/min — ABNORMAL LOW (ref >60)
Glucose, Bld: 198 mg/dL — ABNORMAL HIGH (ref 70–99)
Potassium: 4.7 mmol/L (ref 3.5–5.1)
Sodium: 131 mmol/L — ABNORMAL LOW (ref 135–145)

## 2018-10-16 LAB — METHYLMALONIC ACID, SERUM: Methylmalonic Acid, Quantitative: 471 nmol/L — ABNORMAL HIGH (ref 0–378)

## 2018-10-16 MED ORDER — INSULIN GLARGINE 100 UNIT/ML ~~LOC~~ SOLN
5.0000 [IU] | Freq: Every day | SUBCUTANEOUS | Status: DC
  Administered 2018-10-16 – 2018-10-17 (×2): 5 [IU] via SUBCUTANEOUS
  Filled 2018-10-16 (×3): qty 0.05

## 2018-10-16 NOTE — Progress Notes (Signed)
   Subjective:  No acute events overnight. Mrs.Christner was examined and evaluated at bedside. She states she does not feel good. Mentions she wants to get out of bed, but her RLE is hurting.   Objective:  Vital signs in last 24 hours: Vitals:   10/15/18 0908 10/15/18 1745 10/15/18 2037 10/16/18 0548  BP: (!) 142/61 123/71 (!) 146/77 (!) 127/59  Pulse: 77 78 76 72  Resp: 18 18 18 18   Temp: 97.7 F (36.5 C) 98.2 F (36.8 C) 98.2 F (36.8 C) 98.3 F (36.8 C)  TempSrc: Oral Oral Oral Oral  SpO2: 99% 100% 100%   Weight:   100.9 kg   Height:       General: chronically ill-appearing female, in pain but in no acute distress  CV: RRR, 2/6 systolic murmur heard throughout  Pulm: clear anteriorly  Abd: obese and firm, normoactive bowel sounds  Ext: RLE remains erythematous (similar to yesterday's exam) and appears more edematous than yesterday. LLE unchanged from yesterday.   Assessment/Plan:  Principal Problem:   Cellulitis of right lower extremity Active Problems:   Liver cirrhosis secondary to NASH (HCC)   AKI (acute kidney injury) (HCC)  # RLE nonpurulent cellulitis with phlegmon RLE erythema stable from yesterday but appears more edematous today. Could be related to infection and phlegmon. Has been receiving SQ Lovenox every day (med history reviewed) but low threshold to order LE doppler to r/o DVT if edema continues to worsen - Continue IV vancomycin Day 8 for a total of 10 days  - Up in chair + PT - Medically stable for discharge but appears she will stay until 5/26 due to issues with paperwork with SNF  # NASH liver cirrhosis  Currently well-compensated. On spironolactone 50 mg QD and Lasix 20 mg QD. Renal function improved from yesterday.   # Chronic HFrEF Has RLE edema in the setting of cellulitis but evoluemic otherwise. On spironolactone and Lasix as above.   # T2DM  CBG above goal.  - Started Lantus 5 units as she became hypoglycemic early this admission while on  10 units, will uptitrate as needed - SSI-S + HS scale    Dispo: Anticipated discharge in approximately 3-4 day(s) pending SNF arrangements.   Burna Cash, MD 10/16/2018, 7:36 AM Pager: [phone]

## 2018-10-16 NOTE — Progress Notes (Signed)
  Date: 10/16/2018  Patient name: Brandy Anderson  Medical record number: [id number]  Date of birth: 1930/09/30   I have seen and evaluated this patient and I have discussed the plan of care with the house staff. Please see Dr. Evelene Croon' note for complete details. I concur with her findings and plan.  Will continue to monitor her leg for worsening/changes.  She is slowly improving.     Inez Catalina, MD 10/16/2018, 11:06 AM

## 2018-10-17 ENCOUNTER — Inpatient Hospital Stay (HOSPITAL_COMMUNITY): Payer: Medicare Other

## 2018-10-17 LAB — GLUCOSE, CAPILLARY
Glucose-Capillary: 152 mg/dL — ABNORMAL HIGH (ref 70–99)
Glucose-Capillary: 244 mg/dL — ABNORMAL HIGH (ref 70–99)
Glucose-Capillary: 198 mg/dL — ABNORMAL HIGH (ref 70–99)
Glucose-Capillary: 188 mg/dL — ABNORMAL HIGH (ref 70–99)

## 2018-10-17 LAB — COMPREHENSIVE METABOLIC PANEL
ALT: 17 U/L (ref 0–44)
AST: 24 U/L (ref 15–41)
Albumin: 2.5 g/dL — ABNORMAL LOW (ref 3.5–5.0)
Alkaline Phosphatase: 92 U/L (ref 38–126)
Anion gap: 8 (ref 5–15)
BUN: 16 mg/dL (ref 8–23)
CO2: 26 mmol/L (ref 22–32)
Calcium: 8.5 mg/dL — ABNORMAL LOW (ref 8.9–10.3)
Chloride: 94 mmol/L — ABNORMAL LOW (ref 98–111)
Creatinine, Ser: 1.25 mg/dL — ABNORMAL HIGH (ref 0.44–1.00)
GFR calc Af Amer: 45 mL/min — ABNORMAL LOW (ref >60)
GFR calc non Af Amer: 39 mL/min — ABNORMAL LOW (ref >60)
Glucose, Bld: 197 mg/dL — ABNORMAL HIGH (ref 70–99)
Potassium: 4.7 mmol/L (ref 3.5–5.1)
Sodium: 128 mmol/L — ABNORMAL LOW (ref 135–145)
Total Bilirubin: 1.4 mg/dL — ABNORMAL HIGH (ref 0.3–1.2)
Total Protein: 6.3 g/dL — ABNORMAL LOW (ref 6.5–8.1)

## 2018-10-17 MED ORDER — SPIRONOLACTONE 25 MG PO TABS
100.0000 mg | ORAL_TABLET | Freq: Every day | ORAL | Status: DC
  Administered 2018-10-17 – 2018-10-18 (×2): 100 mg via ORAL
  Filled 2018-10-17 (×2): qty 4

## 2018-10-17 MED ORDER — FUROSEMIDE 40 MG PO TABS
40.0000 mg | ORAL_TABLET | Freq: Every day | ORAL | Status: DC
  Administered 2018-10-17 – 2018-10-18 (×2): 40 mg via ORAL
  Filled 2018-10-17 (×2): qty 1

## 2018-10-17 NOTE — Progress Notes (Signed)
  Date: 10/17/2018  Patient name: Brandy Anderson  Medical record number: [id number]  Date of birth: [birth year]   I have seen and evaluated this patient and I have discussed the plan of care with the house staff. Please see Dr. Al Decant note for complete details. I concur with her findings and plan.    Inez Catalina, MD 10/17/2018, 11:51 AM

## 2018-10-17 NOTE — Progress Notes (Addendum)
   Subjective:  Seen on rounds this morning Brandy Anderson said she was doing well although she feels like she sleeps too much. She denies shortness of breath, abdominal pain, nausea, or leg pain.   Objective:  Vital signs in last 24 hours: Vitals:   10/16/18 0959 10/16/18 1650 10/16/18 2115 10/17/18 0426  BP: (!) 132/57 (!) 134/58 (!) 120/58 (!) 141/62  Pulse: 78 75 74 71  Resp: 18 18 17 18   Temp: 98.2 F (36.8 C) 98.1 F (36.7 C) 98.9 F (37.2 C) 98.3 F (36.8 C)  TempSrc: Oral Oral Oral Oral  SpO2: 100% 100% 99% 99%  Weight:   91.9 kg   Height:       Constitution: NAD, supine in bed Cardio: RRR, III/VI systolic murmur Respiratory: CTA, no w/r/r  Abdominal: increased distention, NTTP, soft, +BS MSK: +2 pitting edema  Neuro: a&ox3 Skin: erythema and warmth RLE    Assessment/Plan:  Principal Problem:   Cellulitis of right lower extremity Active Problems:   Liver cirrhosis secondary to NASH (HCC)   AKI (acute kidney injury) (HCC)  RLE nonpurulent cellulitis with phlegmon RLE erythema stable but continues to appear edematous. Possibly secondary to infection and phlegmon. Has been receiving SQ Lovenox every day (med history reviewed) but low threshold to order LE doppler to r/o DVT if edema continues to worsen - Continue IV vancomycin Day 8 for a total of 10 days  - Up in chair + PT - Medically stable for discharge but appears she will stay until 5/26 due to issues with paperwork with SNF  NASH liver cirrhosis  Currently well-compensated but abdomen continues to be distended and is somewhat firmer today. On spironolactone 50 mg QD and Lasix 20 mg QD. Renal function improved from yesterday.   - increase spiro and lasix today  - abdominal US  Chronic HFrEF Has RLE edema in the setting of cellulitis but evoluemic otherwise. On spironolactone and Lasix as above.   - increase lasix to 40 mg po and spiro to 100 mg today  - afternoon BMP  - am BMP   T2DM  CBG above  goal. Started lantus yesterday at 5U as she became hypoglycemic early on in admission.  - Continue Lantus 5 units, will uptitrate as needed - SSI-S + HS scale    VTE: lovenox IVF: none Diet: renal/carb modified  Code: DNR .  Dispo: Anticipated discharge in approximately 1-2 day(s) pending SNF arrangements.    Brandy Starks, Brandy Anderson 10/17/2018, 6:43 AM Pager: [phone]

## 2018-10-17 NOTE — Progress Notes (Signed)
Pharmacy Antibiotic Note  Brandy Anderson is a 85 y.o. female admitted on 10/09/2018 for RLE nonpurulent cellulitis with phlegmon. The patient is noted to have an allergy listed to Vancomycin however has been tolerating. Pharmacy consulted to dose.   SCr 1.08, CrCl~40 ml/min.  Plan: Vancomycin 750 mg IV Q 24 hrs through 5/25. Will place stop date. Goal AUC 400-550. Expected AUC: 497 SCr used: 1.08  Height: 5\' 5"  (165.1 cm) Weight: 202 lb 9.6 oz (91.9 kg) IBW/kg (Calculated) : 57  Temp (24hrs), Avg:98.4 F (36.9 C), Min:98.1 F (36.7 C), Max:98.9 F (37.2 C)  Recent Labs  Lab 10/11/18 0318 10/12/18 0407 10/13/18 0337 10/14/18 0716 10/15/18 0408 10/16/18 0649  WBC 10.5 8.6 8.2 9.6 8.6  --   CREATININE 1.45* 1.28* 1.18* 1.40* 1.29* 1.08*     Antimicrobials this admission: 5/16 vancomycin > (5/25) 5/16 cefepime x1  Dose adjustments this admission: N/A  Microbiology results: 5/16 Bcx >> ngtd Covid negative  Danae Orleans, PharmD PGY1 Pharmacy Resident Phone [phone] 10/17/2018       9:18 AM

## 2018-10-18 LAB — COMPREHENSIVE METABOLIC PANEL
ALT: 16 U/L (ref 0–44)
AST: 31 U/L (ref 15–41)
Albumin: 2.4 g/dL — ABNORMAL LOW (ref 3.5–5.0)
Alkaline Phosphatase: 92 U/L (ref 38–126)
Anion gap: 6 (ref 5–15)
BUN: 15 mg/dL (ref 8–23)
CO2: 26 mmol/L (ref 22–32)
Calcium: 8.4 mg/dL — ABNORMAL LOW (ref 8.9–10.3)
Chloride: 97 mmol/L — ABNORMAL LOW (ref 98–111)
Creatinine, Ser: 1.18 mg/dL — ABNORMAL HIGH (ref 0.44–1.00)
GFR calc Af Amer: 49 mL/min — ABNORMAL LOW (ref >60)
GFR calc non Af Amer: 42 mL/min — ABNORMAL LOW (ref >60)
Glucose, Bld: 182 mg/dL — ABNORMAL HIGH (ref 70–99)
Potassium: 4.9 mmol/L (ref 3.5–5.1)
Sodium: 129 mmol/L — ABNORMAL LOW (ref 135–145)
Total Bilirubin: 1.6 mg/dL — ABNORMAL HIGH (ref 0.3–1.2)
Total Protein: 5.8 g/dL — ABNORMAL LOW (ref 6.5–8.1)

## 2018-10-18 LAB — GLUCOSE, CAPILLARY
Glucose-Capillary: 176 mg/dL — ABNORMAL HIGH (ref 70–99)
Glucose-Capillary: 295 mg/dL — ABNORMAL HIGH (ref 70–99)
Glucose-Capillary: 220 mg/dL — ABNORMAL HIGH (ref 70–99)
Glucose-Capillary: 183 mg/dL — ABNORMAL HIGH (ref 70–99)

## 2018-10-18 MED ORDER — INSULIN ASPART 100 UNIT/ML ~~LOC~~ SOLN: 0.0000 [IU] | Freq: Every day | SUBCUTANEOUS | Status: DC

## 2018-10-18 MED ORDER — SPIRONOLACTONE 25 MG PO TABS
50.0000 mg | ORAL_TABLET | Freq: Every day | ORAL | Status: DC
  Administered 2018-10-19: 50 mg via ORAL
  Filled 2018-10-18: qty 2

## 2018-10-18 MED ORDER — FUROSEMIDE 20 MG PO TABS: 20.0000 mg | ORAL_TABLET | Freq: Once | ORAL | Status: DC

## 2018-10-18 MED ORDER — INSULIN ASPART 100 UNIT/ML ~~LOC~~ SOLN
0.0000 [IU] | Freq: Three times a day (TID) | SUBCUTANEOUS | Status: DC
  Administered 2018-10-18: 5 [IU] via SUBCUTANEOUS
  Administered 2018-10-18 – 2018-10-19 (×2): 8 [IU] via SUBCUTANEOUS
  Administered 2018-10-19: 5 [IU] via SUBCUTANEOUS
  Administered 2018-10-19: 2 [IU] via SUBCUTANEOUS

## 2018-10-18 MED ORDER — INSULIN GLARGINE 100 UNIT/ML ~~LOC~~ SOLN
8.0000 [IU] | Freq: Every day | SUBCUTANEOUS | Status: DC
  Administered 2018-10-18: 8 [IU] via SUBCUTANEOUS
  Filled 2018-10-18 (×2): qty 0.08

## 2018-10-18 MED ORDER — FUROSEMIDE 20 MG PO TABS
20.0000 mg | ORAL_TABLET | Freq: Every day | ORAL | Status: DC
  Administered 2018-10-19: 20 mg via ORAL
  Filled 2018-10-18: qty 1

## 2018-10-18 MED ORDER — SPIRONOLACTONE 25 MG PO TABS: 50.0000 mg | ORAL_TABLET | Freq: Once | ORAL | Status: DC

## 2018-10-18 NOTE — Progress Notes (Signed)
   Subjective:  Examined at bedside this AM. States she was feeling great until she had to 'get shots.' States significant improvement in her right leg swelling and pain.  Objective:  Vital signs in last 24 hours: Vitals:   10/17/18 0941 10/17/18 1700 10/17/18 2041 10/18/18 0423  BP: (!) 143/65 125/71 (!) 139/59 134/72  Pulse: 71 68 70 76  Resp: 18 18 19 19   Temp: 98.2 F (36.8 C) 97.6 F (36.4 C) 98.1 F (36.7 C) 98.2 F (36.8 C)  TempSrc: Oral Oral Oral Oral  SpO2: 100% 100% 100% 98%  Weight:   90.1 kg   Height:       General: NAD, supine in bed Pulm: no increased work of breathing  Abd: distended, NTND, bowel sounds present Neuro: nml affect Ext: 1+ pitting edema, decreased erythema and warmth RLE  Assessment/Plan:  Principal Problem:   Cellulitis of right lower extremity Active Problems:   Liver cirrhosis secondary to NASH (HCC)   AKI (acute kidney injury) (HCC)   RLE Cellulitis with Phlegmon Coloration continues to improve. Edema decreased with increase in diuretic therapy yesterday. Completed vancomycin today. DC to SNF when available.   - cont. PT   NASH Liver Cirrhosis Stable.   - cont increased dose lasix and spiro today  Chronic HFrEF LE edema improved with increased dose of diuretics yesterday. Will continue increased dose of lasix 40 mg qd and spironolactone 50 mg qd today.   TIIDM CBG continues to be above goal.   - increase lantus from 5U to 8U  - increase to moderate SSI  - SSI qhs  VTE: lovenox  IVF: none  Diet: card modified  Code: DNR  Dispo: Anticipated discharge pending SNF placement.   Versie Starks, DO 10/18/2018, 6:45 AM Pager: [phone]

## 2018-10-18 NOTE — Progress Notes (Signed)
Patient's pasrr number still under review. Patient unable to discharge without pasrr per SNF.    Osborne Casco Waneda Klammer LCSW [phone]

## 2018-10-18 NOTE — Progress Notes (Signed)
  Date: 10/18/2018  Patient name: Brandy Anderson  Medical record number: [id number]  Date of birth: 1931/02/28   I have seen and evaluated this patient and I have discussed the plan of care with the house staff. Please see Dr. Al Decant note for complete details. I concur with her findings and plan.  Hopefully can discharge back to SNF tomorrow.     Inez Catalina, MD 10/18/2018, 6:49 PM

## 2018-10-18 NOTE — Progress Notes (Signed)
Physical Therapy Treatment Patient Details Name: Brandy Anderson MRN: [id number] DOB: [birth year] Today's Date: 10/18/2018    History of Present Illness 85 year old person living with NASH cirrhosis admitted with a severe nonpurulent cellulitis of the right lower extremity which is improving with IV vancomycin.    PT Comments    Patient seen for PT intervention. Session focused on EOB activity tolerance, therapeutic exercise and functional task performance. Patient tolerated well with some improvements noted. Current POC remains appropriate.   Follow Up Recommendations  SNF     Equipment Recommendations  Other (comment)    Recommendations for Other Services       Precautions / Restrictions Precautions Precautions: Fall    Mobility  Bed Mobility Overal bed mobility: Needs Assistance Bed Mobility: Supine to Sit;Sit to Supine     Supine to sit: Mod assist;HOB elevated Sit to supine: Max assist   General bed mobility comments: moderate assist to bring LEs off EOB and power to upright, increased time and effort. Max assist to return to supine and reposition  Transfers                    Ambulation/Gait                 Stairs             Wheelchair Mobility    Modified Rankin (Stroke Patients Only)       Balance Overall balance assessment: Needs assistance Sitting-balance support: Feet supported Sitting balance-Leahy Scale: Fair Sitting balance - Comments: able to performed functional task performance at EOB today without assist, able to take challenge for LE exercises                                    Cognition Arousal/Alertness: Awake/alert Behavior During Therapy: WFL for tasks assessed/performed Overall Cognitive Status: Within Functional Limits for tasks assessed                                        Exercises General Exercises - Lower Extremity Ankle Circles/Pumps: 20 reps;Both;AROM Long Arc Quad:  AROM;15 reps;Seated Hip ABduction/ADduction: 10 reps;AROM;Seated Hip Flexion/Marching: AROM;15 reps;Seated    General Comments General comments (skin integrity, edema, etc.): tolerated EOB for functional task performance, brushed teeth, shampood and combed hair      Pertinent Vitals/Pain Pain Assessment: Faces Faces Pain Scale: Hurts even more Pain Location: stomach ache Pain Descriptors / Indicators: Aching Pain Intervention(s): Monitored during session    Home Living                      Prior Function            PT Goals (current goals can now be found in the care plan section) Acute Rehab PT Goals Patient Stated Goal: none stated, pt happy to be up OOB PT Goal Formulation: With patient Time For Goal Achievement: 10/25/18 Potential to Achieve Goals: Good Progress towards PT goals: Progressing toward goals    Frequency    Min 2X/week      PT Plan Current plan remains appropriate    Co-evaluation              AM-PAC PT "6 Clicks" Mobility   Outcome Measure  Help needed turning from your back to your side while in a  flat bed without using bedrails?: A Lot Help needed moving from lying on your back to sitting on the side of a flat bed without using bedrails?: A Lot Help needed moving to and from a bed to a chair (including a wheelchair)?: A Lot Help needed standing up from a chair using your arms (e.g., wheelchair or bedside chair)?: A Lot Help needed to walk in hospital room?: Total Help needed climbing 3-5 steps with a railing? : Total 6 Click Score: 10    End of Session   Activity Tolerance: Patient tolerated treatment well Patient left: in bed;with call bell/phone within reach;with nursing/sitter in room Nurse Communication: Mobility status PT Visit Diagnosis: Unsteadiness on feet (R26.81);Muscle weakness (generalized) (M62.81);Other abnormalities of gait and mobility (R26.89);Pain Pain - Right/Left: Right Pain - part of body: Leg      Time: 1353-1414 PT Time Calculation (min) (ACUTE ONLY): 21 min  Charges:  $Therapeutic Activity: 8-22 mins                     Charlotte Crumb, PT DPT  Board Certified Neurologic Specialist Acute Rehabilitation Services Pager [phone] Office [phone]    Fabio Asa 10/18/2018, 2:17 PM

## 2018-10-19 DIAGNOSIS — R239 Unspecified skin changes: Secondary | ICD-10-CM

## 2018-10-19 DIAGNOSIS — Z885 Allergy status to narcotic agent status: Secondary | ICD-10-CM

## 2018-10-19 DIAGNOSIS — Z881 Allergy status to other antibiotic agents status: Secondary | ICD-10-CM

## 2018-10-19 DIAGNOSIS — Z888 Allergy status to other drugs, medicaments and biological substances status: Secondary | ICD-10-CM

## 2018-10-19 LAB — COMPREHENSIVE METABOLIC PANEL
ALT: 19 U/L (ref 0–44)
AST: 26 U/L (ref 15–41)
Albumin: 2.6 g/dL — ABNORMAL LOW (ref 3.5–5.0)
Alkaline Phosphatase: 104 U/L (ref 38–126)
Anion gap: 8 (ref 5–15)
BUN: 16 mg/dL (ref 8–23)
CO2: 27 mmol/L (ref 22–32)
Calcium: 8.7 mg/dL — ABNORMAL LOW (ref 8.9–10.3)
Chloride: 96 mmol/L — ABNORMAL LOW (ref 98–111)
Creatinine, Ser: 1.28 mg/dL — ABNORMAL HIGH (ref 0.44–1.00)
GFR calc Af Amer: 44 mL/min — ABNORMAL LOW (ref >60)
GFR calc non Af Amer: 38 mL/min — ABNORMAL LOW (ref >60)
Glucose, Bld: 180 mg/dL — ABNORMAL HIGH (ref 70–99)
Potassium: 4.9 mmol/L (ref 3.5–5.1)
Sodium: 131 mmol/L — ABNORMAL LOW (ref 135–145)
Total Bilirubin: 1.7 mg/dL — ABNORMAL HIGH (ref 0.3–1.2)
Total Protein: 6.6 g/dL (ref 6.5–8.1)

## 2018-10-19 LAB — GLUCOSE, CAPILLARY
Glucose-Capillary: 148 mg/dL — ABNORMAL HIGH (ref 70–99)
Glucose-Capillary: 244 mg/dL — ABNORMAL HIGH (ref 70–99)
Glucose-Capillary: 292 mg/dL — ABNORMAL HIGH (ref 70–99)

## 2018-10-19 LAB — SARS CORONAVIRUS 2 BY RT PCR (HOSPITAL ORDER, PERFORMED IN ~~LOC~~ HOSPITAL LAB): SARS Coronavirus 2: NEGATIVE

## 2018-10-19 LAB — MAGNESIUM: Magnesium: 1.1 mg/dL — ABNORMAL LOW (ref 1.7–2.4)

## 2018-10-19 MED ORDER — INSULIN ASPART 100 UNIT/ML ~~LOC~~ SOLN
2.0000 [IU] | Freq: Three times a day (TID) | SUBCUTANEOUS | Status: DC
  Administered 2018-10-19 (×3): 2 [IU] via SUBCUTANEOUS

## 2018-10-19 MED ORDER — INSULIN GLARGINE 100 UNIT/ML ~~LOC~~ SOLN: 10.0000 [IU] | Freq: Every day | SUBCUTANEOUS | 11 refills | Status: AC

## 2018-10-19 MED ORDER — INSULIN GLARGINE 100 UNIT/ML ~~LOC~~ SOLN
10.0000 [IU] | Freq: Every day | SUBCUTANEOUS | Status: DC
  Filled 2018-10-19: qty 0.1

## 2018-10-19 MED ORDER — MAGNESIUM SULFATE 4 GM/100ML IV SOLN
4.0000 g | Freq: Once | INTRAVENOUS | Status: AC
  Administered 2018-10-19: 4 g via INTRAVENOUS
  Filled 2018-10-19: qty 100

## 2018-10-19 NOTE — Progress Notes (Signed)
Brandy Anderson to be discharged Skilled nursing facility [facility] per MD order. Patient verbalized understanding.  Skin clean, dry and intact without evidence of skin break down, no evidence of skin tears noted. IV catheter discontinued intact. Site without signs and symptoms of complications. Dressing and pressure applied. Pt denies pain at the site currently. No complaints noted.  Patient free of lines, drains, and wounds.   Discharge packet assembled. An After Visit Summary (AVS) was printed and given to the EMS personnel. Patient escorted via stretcher and discharged to [facility] via ambulance. Report called to accepting facility; all questions and concerns addressed.   Arvilla Meres, RN

## 2018-10-19 NOTE — Progress Notes (Signed)
  Date: 10/19/2018  Patient name: Brandy Anderson  Medical record number: [id number]  Date of birth: January 15, 1931   I have seen and evaluated this patient and I have discussed the plan of care with the house staff. Please see Dr. Al Decant note for complete details. I concur with her findings and plan.  Patient is much improved.  She completed her antibiotics course.  Her RLE continues to be edematous due to known phlegmon, but is no longer warm or tender.  She has chronic skin changes of bilateral lower extremities which persist.  Given her swelling, she will be at risk for recurrent cellulitis.  If this recurs requiring another hospital stay, could consider prophylactic Abx going forward.  She is ready for discharge to SNF.     Inez Catalina, MD 10/19/2018, 10:21 AM

## 2018-10-19 NOTE — TOC Transition Note (Signed)
Transition of Care [facility]) - CM/SW Discharge Note   Patient Details  Name: Shemica Huss MRN: [id number] Date of Birth: [birth year]  [facility] of [facility] CM/SW Contact:  Cristobal Goldmann, LCSW Phone Number: 10/19/2018, 4:51 PM   Clinical Narrative:  Patient will discharge to [facility]) today. CSW received call from [place] with [facility] regarding level 2 screen. They are not coming to the hospital since COVID-19 and 2 pm appointment scheduled as the time that she will call and CSW will go to patient's room for Level 2 screen. Screen conducted with patient by Britta Mccreedy via phone and CSW assistance. Discharge summary transmitted to facility and [facility] transport arranged. Daughter, Ron Parker [phone]) contacted and informed of discharge.  Meryle Ready also advised that Level 2 screen completed and patient can discharge. Also talked with facility director regarding patient being able to discharge without [facility] and referred her to bulletin from [facility] web site.    Final next level of care: Skilled Nursing Facility([facility] Barriers to Discharge: SNF Pending discharge summary   Patient Goals and CMS Choice Patient states their goals for this hospitalization and ongoing recovery are:: Daughter wants patient to get stronger before returning to ALF CMS Medicare.gov Compare Post Acute Care list provided to:: Other (Comment Required)(Not needed as patient going to SNF [facility]) Choice offered to / list presented to : NA  Discharge Placement: [facility] SNF)                      Discharge Plan and Services In-house Referral: Clinical Social Work   Post Acute Care Choice: Skilled Nursing Facility - [facility]                               Social Determinants of Health (SDOH) Interventions  No SDOH interventions needed prior to discharge.   Readmission Risk Interventions No flowsheet  data found.

## 2018-10-19 NOTE — Care Management Important Message (Signed)
Important Message  Patient Details  Name: Brandy Anderson MRN: [id number] Date of Birth: 1930/11/25   Medicare Important Message Given:  Yes    Jleigh Striplin 10/19/2018, 3:15 PM

## 2018-10-19 NOTE — Progress Notes (Signed)
   Subjective:  Doing well this morning. Breakfast wasn't as good as usual. No LE pain.   Objective:  Vital signs in last 24 hours: Vitals:   10/18/18 0921 10/18/18 1631 10/18/18 2107 10/19/18 0446  BP: 123/77 136/60 119/61 131/63  Pulse: 76 71 71 76  Resp: 16 18 18 18   Temp: 98.5 F (36.9 C) 98.5 F (36.9 C) (!) 97.3 F (36.3 C) 98.5 F (36.9 C)  TempSrc: Oral Oral Oral Oral  SpO2: 100% 97% 100% 99%  Weight:   89.7 kg   Height:       Constitution: NAD, alert & oriented MSK: +1 pitting edema, moving all extremities Neuro: normal affect, pleasant Skin: RLE erythema, no warmth, discoleration from phlegmon resolving   Assessment/Plan:  Principal Problem:   Cellulitis of right lower extremity Active Problems:   Liver cirrhosis secondary to NASH (HCC)   AKI (acute kidney injury) (HCC)   RLE Cellulitis with Phlegmon Coloration continuing to improve although may be chronic discoloration as well. Edema decreased with continued diuresis yesterday. Completed vancomycin yesterday. DC to SNF when available.   - cont. PT   NASH Liver Cirrhosis Stable.   - cont lasix 20 mg qd and spiro 50 mg qd - repleting magnesium.   Chronic HFrEF LE edema improved with continueing increased dose of diuretics yesterday. Will continue lasix at 20 mg and spiro at 50 mg qd.   TIIDM CBG continues to be above goal.   - increase lantus to 10U  - continue moderate SSI  - SSI qhs - add novolog 2U qw  VTE: lovenox IVF: none  Diet: sodium restriction Code: DNR  Dispo: Anticipated discharge pending SNF bed.   Versie Starks, DO 10/19/2018, 6:28 AM Pager: [phone]

## 2019-04-26 DEATH — deceased

## 2020-08-03 IMAGING — CT CT HEAD WITHOUT CONTRAST
4 series · 15 of 47 positions shown, 17 images · non-contrast
Comparison: None.

CLINICAL DATA: Altered mental status

EXAM:
CT HEAD WITHOUT CONTRAST
TECHNIQUE: Contiguous axial images were obtained from the base of the skull
through the vertex without intravenous contrast.

[Series 3: head wo · axial · 0.45mm/px · z∈[-162,-37]mm · 7 of 35 slices shown, 9 images]
[im 5/35  brain]
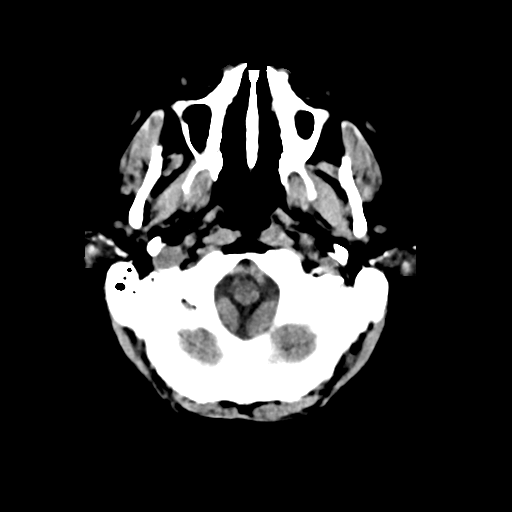
[im 5/35  bone]
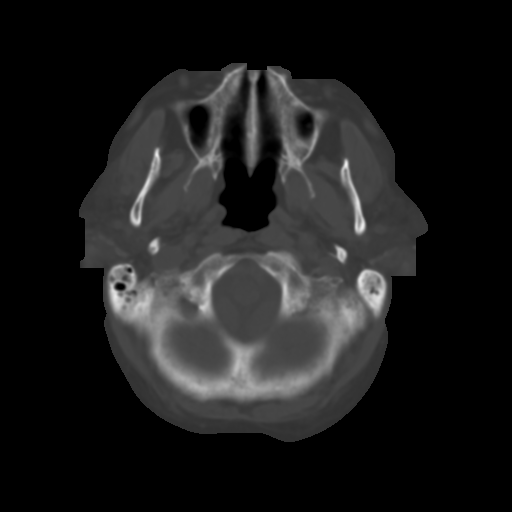
[im 9/35  brain]
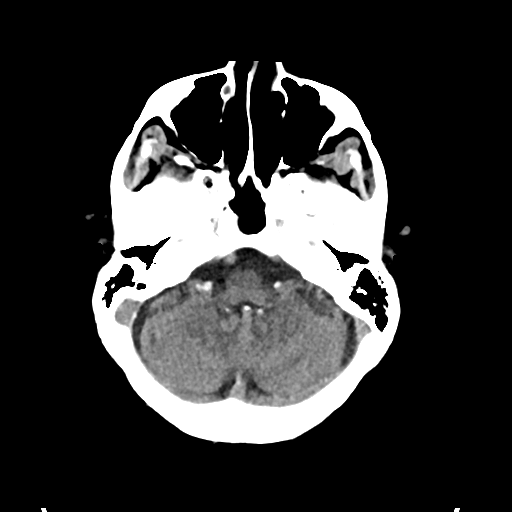
[im 13/35  brain]
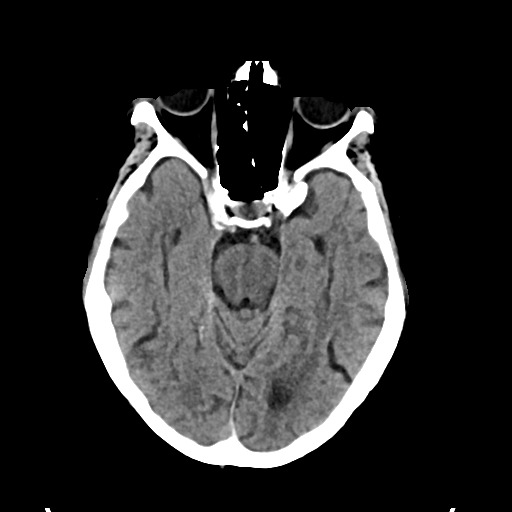
[im 18/35  brain]
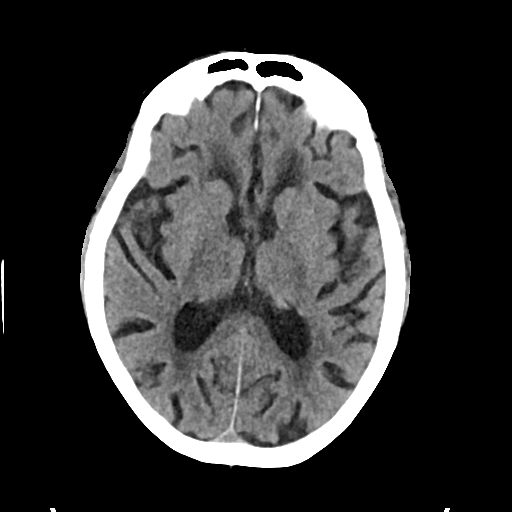
[im 22/35  brain]
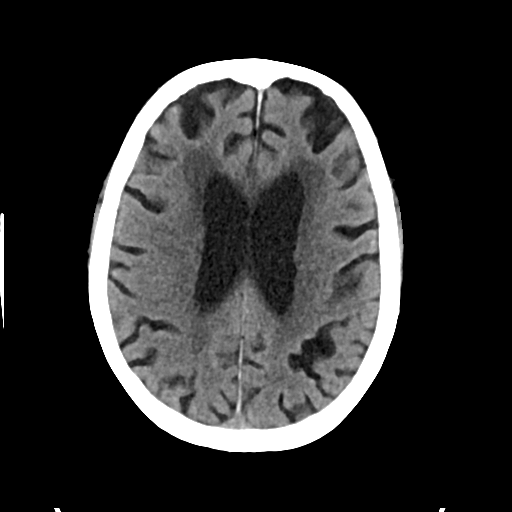
[im 22/35  bone]
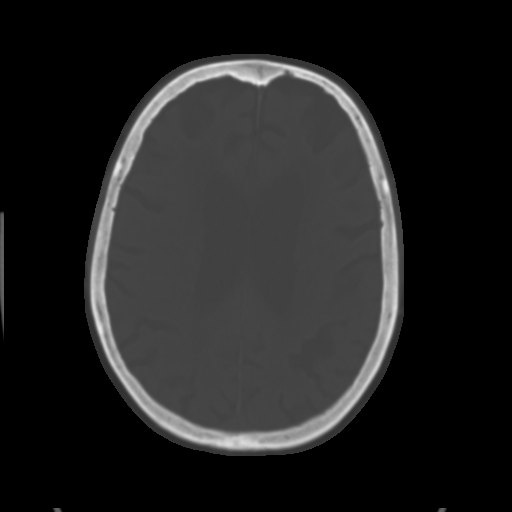
[im 26/35  brain]
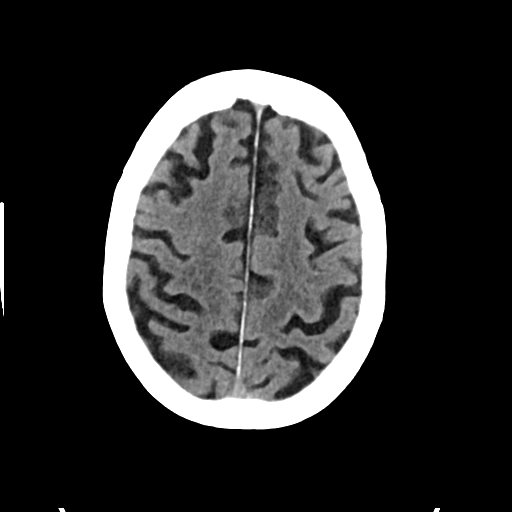
[im 30/35  brain]
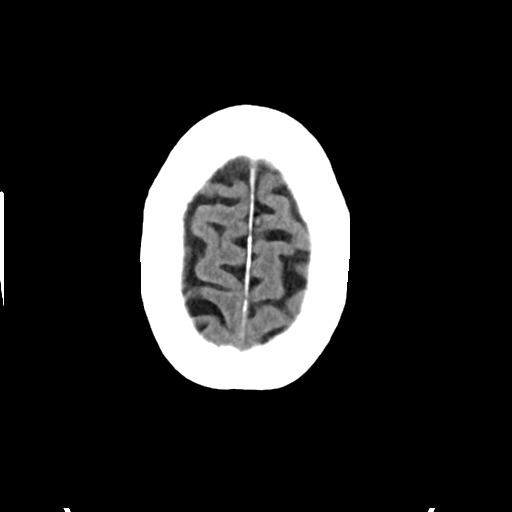

[Series 4: head bone · axial · 0.45mm/px · z∈[-166,-148]mm · 2 of 86 slices shown]
[im 9/86  bone]
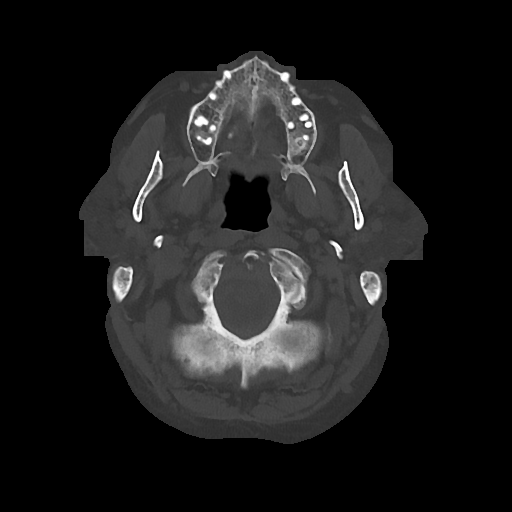
[im 18/86  bone]
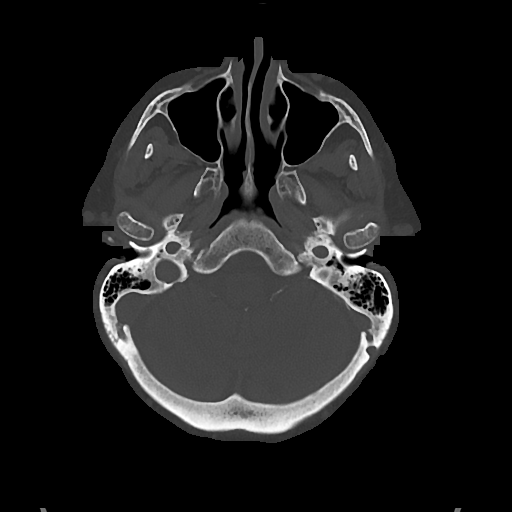

[Series 5: cor soft · coronal · 0.33mm/px · 3 of 80 slices shown]
[im 27/80  brain]
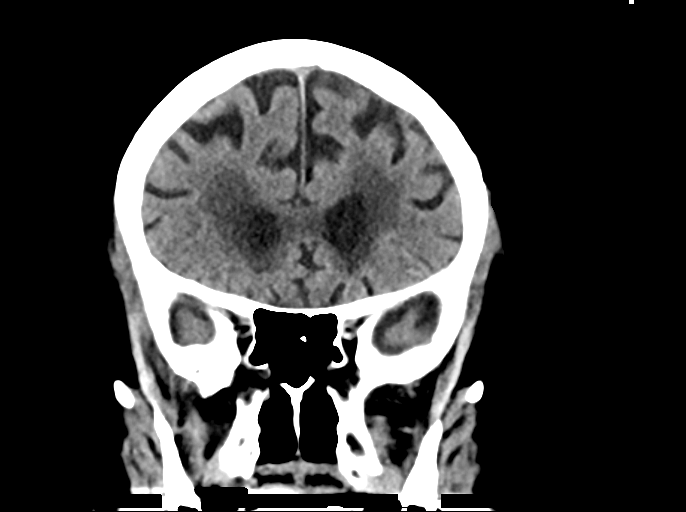
[im 36/80  brain]
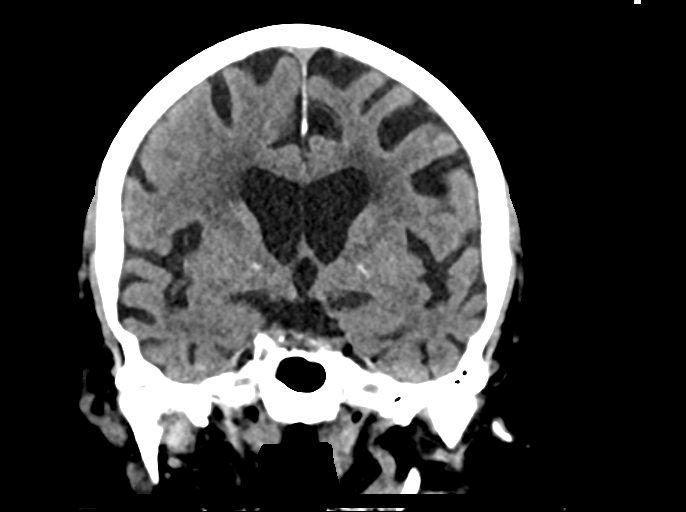
[im 44/80  brain]
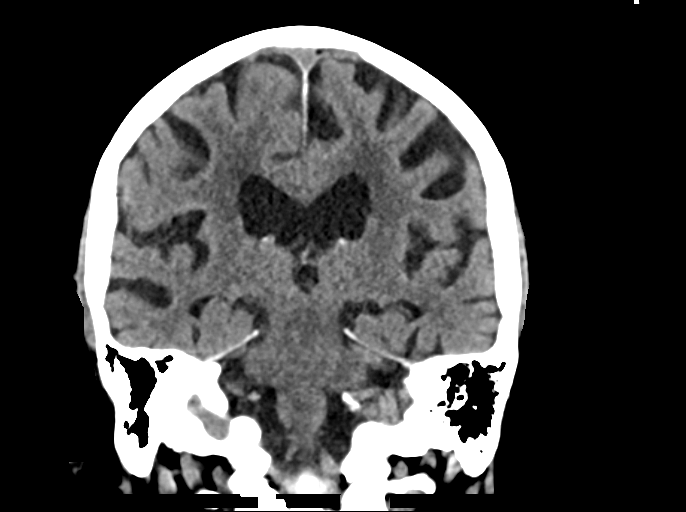

[Series 6: sag soft · sagittal · 0.33mm/px · 3 of 61 slices shown]
[im 21/61  brain]
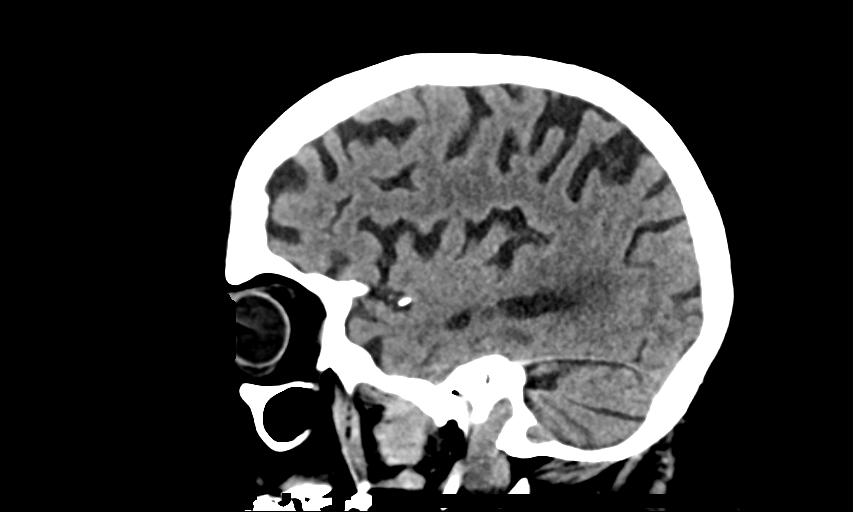
[im 31/61  brain]
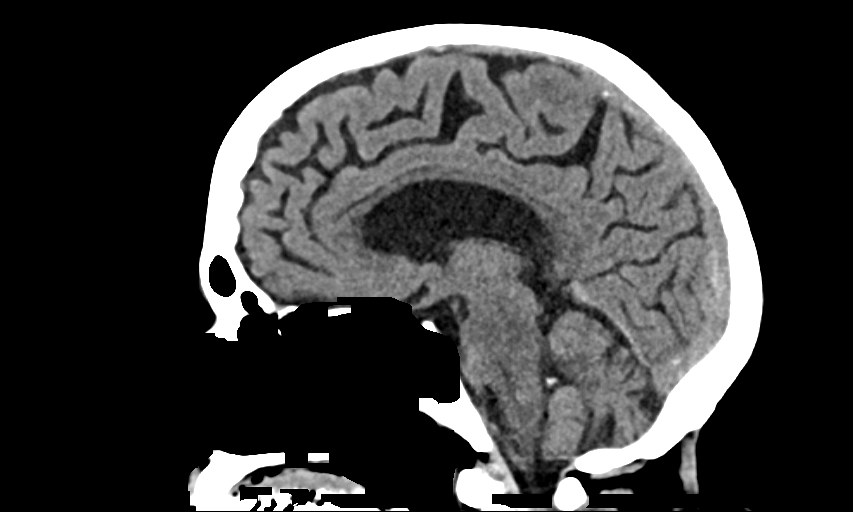
[im 41/61  brain]
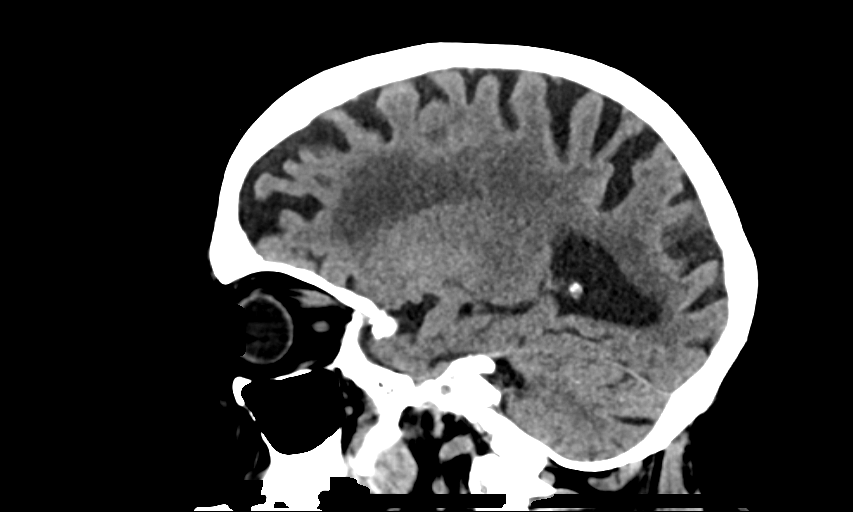

[15 of 47 positions shown; findings below may reference images not displayed]

FINDINGS: Brain: There is no mass, hemorrhage or extra-axial collection. There
is generalized atrophy without lobar predilection. Hypodensity of
the white matter is most commonly associated with chronic
microvascular disease.

Vascular: No abnormal hyperdensity of the major intracranial
arteries or dural venous sinuses. No intracranial atherosclerosis.

Skull: The visualized skull base, calvarium and extracranial soft
tissues are normal.

Sinuses/Orbits: No fluid levels or advanced mucosal thickening of
the visualized paranasal sinuses. No mastoid or middle ear effusion.
The orbits are normal.
IMPRESSION: Chronic small vessel ischemia without acute intracranial
abnormality.

## 2020-08-03 IMAGING — DX PORTABLE CHEST - 1 VIEW
1 series · 1 of 1 positions shown · non-contrast
Comparison: None.

CLINICAL DATA: Altered mental status

EXAM:
PORTABLE CHEST 1 VIEW

[chest ap]
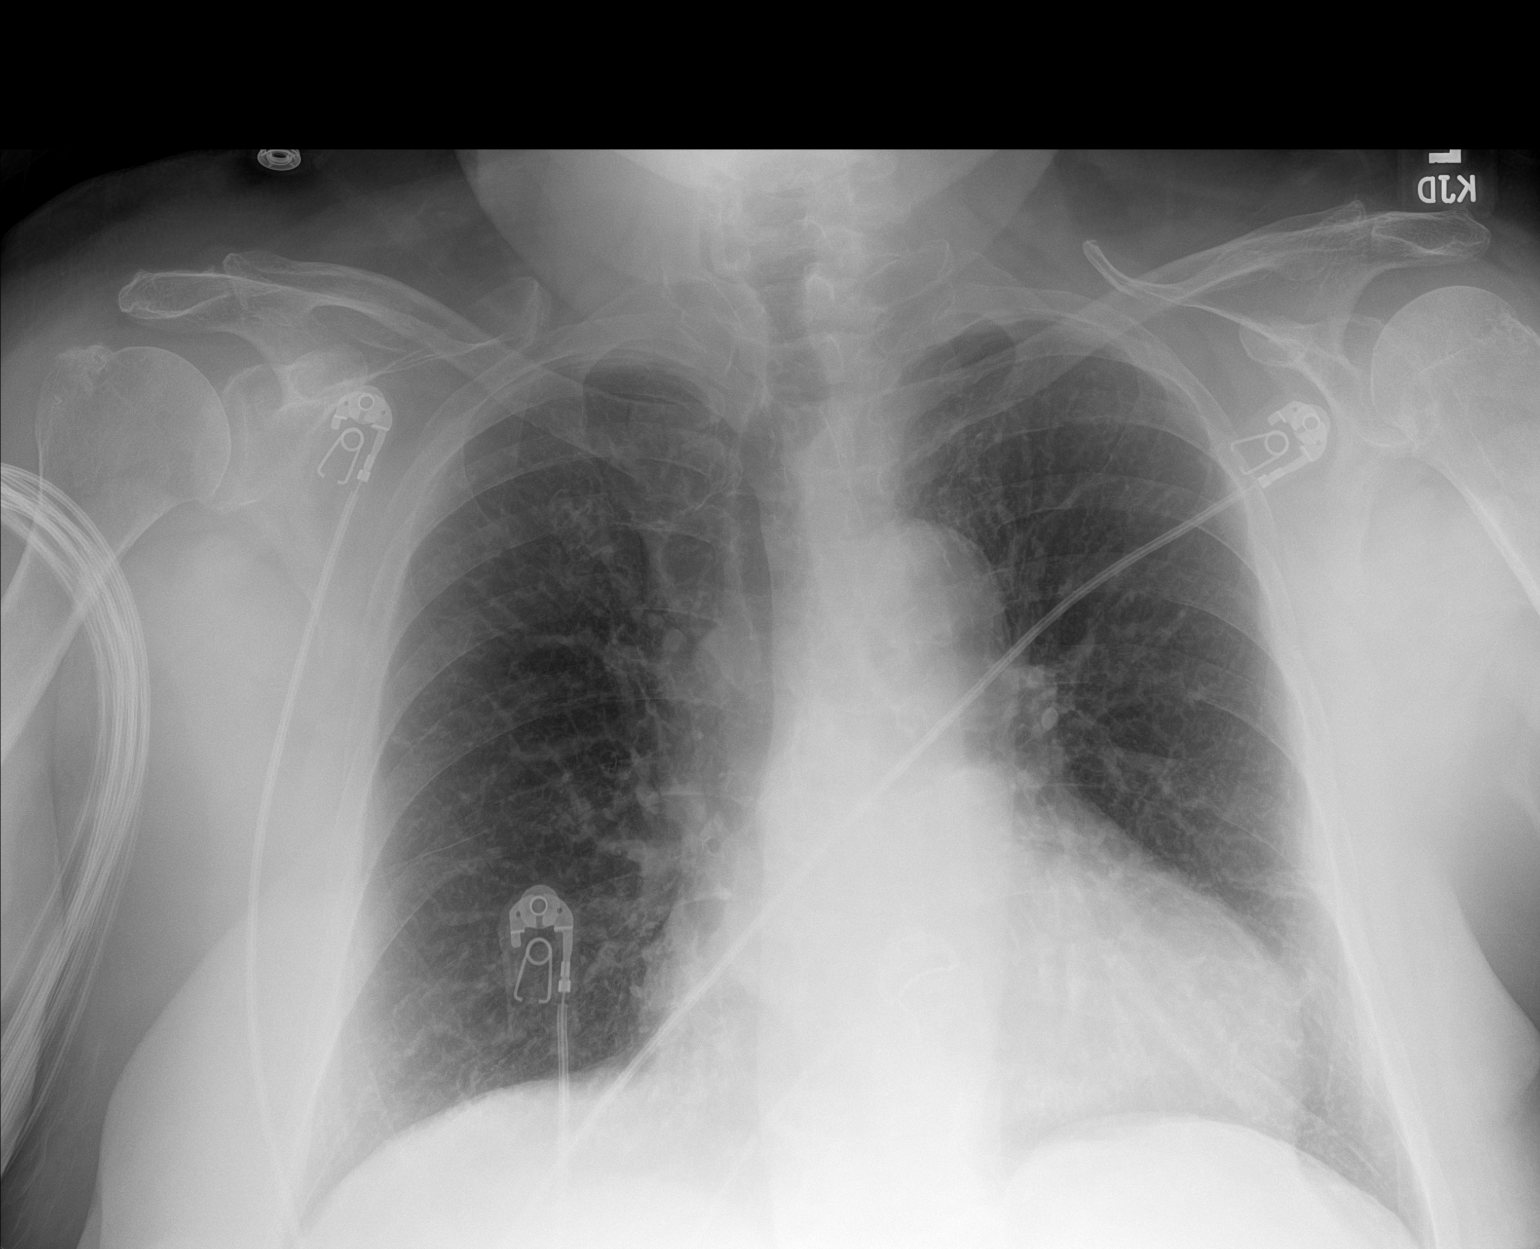

[1 of 1 positions shown; findings below may reference images not displayed]

FINDINGS: The heart size and mediastinal contours are within normal limits.
Both lungs are clear. The visualized skeletal structures are
unremarkable.
IMPRESSION: No active disease.
